# Patient Record
Sex: Male | Born: 1975
Health system: Southern US, Community
[De-identification: ages and names within clinical notes are randomized; demographics above are authoritative.]

## PROBLEM LIST (undated history)

## (undated) DIAGNOSIS — L0291 Cutaneous abscess, unspecified: Secondary | ICD-10-CM

## (undated) DIAGNOSIS — I10 Essential (primary) hypertension: Secondary | ICD-10-CM

## (undated) HISTORY — PX: HERNIA REPAIR: SHX51

## (undated) HISTORY — PX: OTHER SURGICAL HISTORY: SHX169

## (undated) HISTORY — PX: FOOT SURGERY: SHX648

---

## 1998-08-28 ENCOUNTER — Encounter: Payer: Self-pay | Admitting: *Deleted

## 1998-08-28 ENCOUNTER — Emergency Department (HOSPITAL_COMMUNITY): Admission: EM | Admit: 1998-08-28 | Discharge: 1998-08-28 | Payer: Self-pay | Admitting: *Deleted

## 2001-07-30 ENCOUNTER — Emergency Department (HOSPITAL_COMMUNITY): Admission: EM | Admit: 2001-07-30 | Discharge: 2001-07-30 | Payer: Self-pay | Admitting: Emergency Medicine

## 2001-09-15 ENCOUNTER — Emergency Department (HOSPITAL_COMMUNITY): Admission: EM | Admit: 2001-09-15 | Discharge: 2001-09-15 | Payer: Self-pay | Admitting: Emergency Medicine

## 2001-09-21 ENCOUNTER — Emergency Department (HOSPITAL_COMMUNITY): Admission: EM | Admit: 2001-09-21 | Discharge: 2001-09-21 | Payer: Self-pay | Admitting: Emergency Medicine

## 2002-01-26 ENCOUNTER — Emergency Department (HOSPITAL_COMMUNITY): Admission: EM | Admit: 2002-01-26 | Discharge: 2002-01-26 | Payer: Self-pay | Admitting: Emergency Medicine

## 2006-02-28 ENCOUNTER — Emergency Department (HOSPITAL_COMMUNITY): Admission: EM | Admit: 2006-02-28 | Discharge: 2006-02-28 | Payer: Self-pay | Admitting: Emergency Medicine

## 2006-03-22 ENCOUNTER — Emergency Department (HOSPITAL_COMMUNITY): Admission: EM | Admit: 2006-03-22 | Discharge: 2006-03-22 | Payer: Self-pay | Admitting: Emergency Medicine

## 2006-05-08 ENCOUNTER — Emergency Department (HOSPITAL_COMMUNITY): Admission: EM | Admit: 2006-05-08 | Discharge: 2006-05-08 | Payer: Self-pay | Admitting: Emergency Medicine

## 2006-05-23 ENCOUNTER — Emergency Department (HOSPITAL_COMMUNITY): Admission: EM | Admit: 2006-05-23 | Discharge: 2006-05-23 | Payer: Self-pay | Admitting: Emergency Medicine

## 2007-09-20 ENCOUNTER — Emergency Department (HOSPITAL_COMMUNITY): Admission: EM | Admit: 2007-09-20 | Discharge: 2007-09-20 | Payer: Self-pay | Admitting: Emergency Medicine

## 2009-02-14 ENCOUNTER — Emergency Department (HOSPITAL_COMMUNITY): Admission: EM | Admit: 2009-02-14 | Discharge: 2009-02-15 | Payer: Self-pay | Admitting: Emergency Medicine

## 2009-10-19 ENCOUNTER — Emergency Department (HOSPITAL_COMMUNITY): Admission: EM | Admit: 2009-10-19 | Discharge: 2009-10-19 | Payer: Self-pay | Admitting: Emergency Medicine

## 2009-10-20 ENCOUNTER — Emergency Department (HOSPITAL_COMMUNITY): Admission: EM | Admit: 2009-10-20 | Discharge: 2009-10-20 | Payer: Self-pay | Admitting: Emergency Medicine

## 2010-05-19 LAB — URINALYSIS, ROUTINE W REFLEX MICROSCOPIC
Bilirubin Urine: NEGATIVE
Glucose, UA: NEGATIVE mg/dL
Hgb urine dipstick: NEGATIVE
Ketones, ur: NEGATIVE mg/dL
Nitrite: NEGATIVE
Protein, ur: NEGATIVE mg/dL
Specific Gravity, Urine: 1.021 (ref 1.005–1.030)
Urobilinogen, UA: 0.2 mg/dL (ref 0.0–1.0)
pH: 5.5 (ref 5.0–8.0)

## 2010-05-19 LAB — GC/CHLAMYDIA PROBE AMP, GENITAL
Chlamydia, DNA Probe: NEGATIVE
GC Probe Amp, Genital: NEGATIVE

## 2010-11-14 LAB — DIFFERENTIAL
Basophils Relative: 3 — ABNORMAL HIGH
Eosinophils Absolute: 0.3
Lymphs Abs: 1.4
Monocytes Absolute: 0.8
Monocytes Relative: 11
Neutrophils Relative %: 62

## 2010-11-14 LAB — COMPREHENSIVE METABOLIC PANEL
ALT: 34
Albumin: 4.6
Alkaline Phosphatase: 60
Calcium: 9.9
GFR calc Af Amer: >60
Glucose, Bld: 94
Potassium: 4.9
Sodium: 137
Total Protein: 7.7

## 2010-11-14 LAB — HEMOGLOBINOPATHY EVALUATION
Hgb A2 Quant: 2.8
Hgb A: 97.2 %
Hgb F Quant: 0 (ref 0.0–2.0)

## 2010-11-14 LAB — RETICULOCYTES
RBC.: 4.76
Retic Count, Absolute: 109.5

## 2010-11-14 LAB — CBC
MCHC: 34.9
Platelets: 294
RDW: 12.4

## 2010-11-14 LAB — RHEUMATOID FACTOR: Rhuematoid fact SerPl-aCnc: <20

## 2010-12-01 ENCOUNTER — Emergency Department (HOSPITAL_COMMUNITY)
Admission: EM | Admit: 2010-12-01 | Discharge: 2010-12-01 | Disposition: A | Discharge status: Home / Self Care | Payer: Self-pay | Attending: Emergency Medicine | Admitting: Emergency Medicine

## 2010-12-01 DIAGNOSIS — K029 Dental caries, unspecified: Secondary | ICD-10-CM | POA: Insufficient documentation

## 2010-12-01 DIAGNOSIS — A64 Unspecified sexually transmitted disease: Secondary | ICD-10-CM | POA: Insufficient documentation

## 2010-12-01 DIAGNOSIS — K1370 Unspecified lesions of oral mucosa: Secondary | ICD-10-CM | POA: Insufficient documentation

## 2010-12-29 ENCOUNTER — Emergency Department (HOSPITAL_COMMUNITY)
Admission: EM | Admit: 2010-12-29 | Discharge: 2010-12-29 | Disposition: A | Discharge status: Home / Self Care | Payer: Self-pay | Attending: Emergency Medicine | Admitting: Emergency Medicine

## 2010-12-29 ENCOUNTER — Encounter: Payer: Self-pay | Admitting: *Deleted

## 2010-12-29 DIAGNOSIS — K0889 Other specified disorders of teeth and supporting structures: Secondary | ICD-10-CM

## 2010-12-29 DIAGNOSIS — K089 Disorder of teeth and supporting structures, unspecified: Secondary | ICD-10-CM | POA: Insufficient documentation

## 2010-12-29 DIAGNOSIS — K029 Dental caries, unspecified: Secondary | ICD-10-CM | POA: Insufficient documentation

## 2010-12-29 MED ORDER — PENICILLIN V POTASSIUM 500 MG PO TABS: 500.0000 mg | ORAL_TABLET | Freq: Three times a day (TID) | ORAL | Status: AC
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
## 2011-04-05 ENCOUNTER — Emergency Department (HOSPITAL_COMMUNITY): Admission: EM | Admit: 2011-04-05 | Discharge: 2011-04-05 | Discharge status: Against Medical Advice | Payer: Self-pay

## 2012-11-23 ENCOUNTER — Other Ambulatory Visit (HOSPITAL_COMMUNITY): Payer: Self-pay

## 2012-12-08 ENCOUNTER — Other Ambulatory Visit (HOSPITAL_COMMUNITY): Payer: Self-pay

## 2012-12-20 ENCOUNTER — Other Ambulatory Visit (HOSPITAL_COMMUNITY): Payer: Self-pay | Admitting: Family Medicine

## 2012-12-20 DIAGNOSIS — R011 Cardiac murmur, unspecified: Secondary | ICD-10-CM

## 2012-12-22 ENCOUNTER — Other Ambulatory Visit (HOSPITAL_COMMUNITY): Payer: Self-pay

## 2012-12-23 ENCOUNTER — Encounter (HOSPITAL_COMMUNITY): Payer: Self-pay | Admitting: Family Medicine

## 2012-12-28 ENCOUNTER — Encounter (HOSPITAL_COMMUNITY): Payer: Self-pay | Admitting: Family Medicine

## 2013-05-06 ENCOUNTER — Encounter (HOSPITAL_COMMUNITY): Payer: Self-pay | Admitting: Emergency Medicine

## 2013-05-06 ENCOUNTER — Emergency Department (INDEPENDENT_AMBULATORY_CARE_PROVIDER_SITE_OTHER)
Admission: EM | Admit: 2013-05-06 | Discharge: 2013-05-06 | Disposition: A | Discharge status: Home / Self Care | Payer: 59 | Source: Home / Self Care | Attending: Family Medicine | Admitting: Family Medicine

## 2013-05-06 DIAGNOSIS — A088 Other specified intestinal infections: Secondary | ICD-10-CM

## 2013-05-06 DIAGNOSIS — A084 Viral intestinal infection, unspecified: Secondary | ICD-10-CM

## 2013-05-06 MED ORDER — ONDANSETRON HCL 8 MG PO TABS: 8.0000 mg | ORAL_TABLET | Freq: Three times a day (TID) | ORAL | Status: DC | PRN

## 2013-05-06 NOTE — ED Provider Notes (Signed)
Wesley Best is a 38 y.o. male who presents to Urgent Care today for abdominal pain and diarrhea present for one week. Patient had worsening abdominal pain yesterday. His abdominal pain is currently resolving. He had one episode of vomiting yesterday as well. He had diarrhea all. The previous week however this is also resolving. He denies any blood in the stool vomiting blood per stool or coffee-ground emesis. He denies any fevers or chills. He's tried some Pepto-Bismol which did not help much. He currently feels well.   History reviewed. No pertinent past medical history. History  Substance Use Topics  . Smoking status: Current Every Day Smoker    Types: Cigarettes  . Smokeless tobacco: Not on file  . Alcohol Use: Yes   ROS as above Medications: No current facility-administered medications for this encounter.   Current Outpatient Prescriptions  Medication Sig Dispense Refill  . acetaminophen-codeine (TYLENOL #3) 300-30 MG per tablet Take 1 tablet by mouth every 6 (six) hours as needed. pain       . Ibuprofen-Diphenhydramine Cit (IBUPROFEN PM) 200-38 MG TABS Take 2 tablets by mouth at bedtime.        . ondansetron (ZOFRAN) 8 MG tablet Take 1 tablet (8 mg total) by mouth every 8 (eight) hours as needed for nausea or vomiting.  20 tablet  0    Exam:  BP 147/108  Pulse 83  Temp(Src) 98.4 F (36.9 C) (Oral)  Resp 19  SpO2 100% Gen: Well NAD nontoxic appearing HEENT: EOMI,  MMM Lungs: Normal work of breathing. CTABL Heart: RRR no MRG Abd: NABS, Soft. NT, ND no rebound or guarding Exts: Brisk capillary refill, warm and well perfused.    Assessment and Plan: 38 y.o. male with viral gastroenteritis. Plan for symptomatic management with Zofran and Imodium. Followup as needed.  Discussed warning signs or symptoms. Please see discharge instructions. Patient expresses understanding.    Rodolph BongEvan S Aloysious Vangieson, MD 05/06/13 [phone] [id number]

## 2013-05-06 NOTE — ED Notes (Signed)
Pt c/o abd pain onset 1 week Sxs include: f/v/d States his wife and children of the stomach virus Not taking any meds for d/c He is alert w/no signs of acute distress.

## 2013-05-06 NOTE — Discharge Instructions (Signed)
Thank you for coming in today. Zofran as needed for vomiting. Use over-the-counter Imodium as directed as needed for diarrhea. Do not take Imodium if you have severe abdominal pain or blood in the stool. If your belly pain worsens, or you have high fever, bad vomiting, blood in your stool or black tarry stool go to the Emergency Room.   Viral Gastroenteritis Viral gastroenteritis is also known as stomach flu. This condition affects the stomach and intestinal tract. It can cause sudden diarrhea and vomiting. The illness typically lasts 3 to 8 days. Most people develop an immune response that eventually gets rid of the virus. While this natural response develops, the virus can make you quite ill. CAUSES  Many different viruses can cause gastroenteritis, such as rotavirus or noroviruses. You can catch one of these viruses by consuming contaminated food or water. You may also catch a virus by sharing utensils or other personal items with an infected person or by touching a contaminated surface. SYMPTOMS  The most common symptoms are diarrhea and vomiting. These problems can cause a severe loss of body fluids (dehydration) and a body salt (electrolyte) imbalance. Other symptoms may include:  Fever.  Headache.  Fatigue.  Abdominal pain. DIAGNOSIS  Your caregiver can usually diagnose viral gastroenteritis based on your symptoms and a physical exam. A stool sample may also be taken to test for the presence of viruses or other infections. TREATMENT  This illness typically goes away on its own. Treatments are aimed at rehydration. The most serious cases of viral gastroenteritis involve vomiting so severely that you are not able to keep fluids down. In these cases, fluids must be given through an intravenous line (IV). HOME CARE INSTRUCTIONS   Drink enough fluids to keep your urine clear or pale yellow. Drink small amounts of fluids frequently and increase the amounts as tolerated.  Ask your  caregiver for specific rehydration instructions.  Avoid:  Foods high in sugar.  Alcohol.  Carbonated drinks.  Tobacco.  Juice.  Caffeine drinks.  Extremely hot or cold fluids.  Fatty, greasy foods.  Too much intake of anything at one time.  Dairy products until 24 to 48 hours after diarrhea stops.  You may consume probiotics. Probiotics are active cultures of beneficial bacteria. They may lessen the amount and number of diarrheal stools in adults. Probiotics can be found in yogurt with active cultures and in supplements.  Wash your hands well to avoid spreading the virus.  Only take over-the-counter or prescription medicines for pain, discomfort, or fever as directed by your caregiver. Do not give aspirin to children. Antidiarrheal medicines are not recommended.  Ask your caregiver if you should continue to take your regular prescribed and over-the-counter medicines.  Keep all follow-up appointments as directed by your caregiver. SEEK IMMEDIATE MEDICAL CARE IF:   You are unable to keep fluids down.  You do not urinate at least once every 6 to 8 hours.  You develop shortness of breath.  You notice blood in your stool or vomit. This may look like coffee grounds.  You have abdominal pain that increases or is concentrated in one small area (localized).  You have persistent vomiting or diarrhea.  You have a fever.  The patient is a child younger than 3 months, and he or she has a fever.  The patient is a child older than 3 months, and he or she has a fever and persistent symptoms.  The patient is a child older than 3 months, and he  or she has a fever and symptoms suddenly get worse.  The patient is a baby, and he or she has no tears when crying. MAKE SURE YOU:   Understand these instructions.  Will watch your condition.  Will get help right away if you are not doing well or get worse. Document Released: 02/02/2005 Document Revised: 04/27/2011 Document  Reviewed: 11/19/2010 [facility] Patient Information 2014 [place], [place].

## 2013-09-08 ENCOUNTER — Encounter (HOSPITAL_COMMUNITY): Payer: Self-pay | Admitting: Emergency Medicine

## 2013-09-08 ENCOUNTER — Emergency Department (HOSPITAL_COMMUNITY)
Admission: EM | Admit: 2013-09-08 | Discharge: 2013-09-08 | Disposition: A | Discharge status: Home / Self Care | Payer: 59 | Attending: Emergency Medicine | Admitting: Emergency Medicine

## 2013-09-08 ENCOUNTER — Emergency Department (HOSPITAL_COMMUNITY): Payer: 59

## 2013-09-08 DIAGNOSIS — Z880 Allergy status to penicillin: Secondary | ICD-10-CM | POA: Insufficient documentation

## 2013-09-08 DIAGNOSIS — K2970 Gastritis, unspecified, without bleeding: Secondary | ICD-10-CM | POA: Diagnosis not present

## 2013-09-08 DIAGNOSIS — Z9889 Other specified postprocedural states: Secondary | ICD-10-CM | POA: Diagnosis not present

## 2013-09-08 DIAGNOSIS — R072 Precordial pain: Secondary | ICD-10-CM | POA: Diagnosis present

## 2013-09-08 DIAGNOSIS — K2990 Gastroduodenitis, unspecified, without bleeding: Secondary | ICD-10-CM | POA: Diagnosis not present

## 2013-09-08 DIAGNOSIS — F17200 Nicotine dependence, unspecified, uncomplicated: Secondary | ICD-10-CM | POA: Diagnosis not present

## 2013-09-08 LAB — CBC
HCT: 44.1 % (ref 39.0–52.0)
Hemoglobin: 15.5 g/dL (ref 13.0–17.0)
MCH: 33 pg (ref 26.0–34.0)
MCHC: 35.1 g/dL (ref 30.0–36.0)
MCV: 94 fL (ref 78.0–100.0)
PLATELETS: 275 10*3/uL (ref 150–400)
RBC: 4.69 MIL/uL (ref 4.22–5.81)
RDW: 12.7 % (ref 11.5–15.5)
WBC: 4.7 10*3/uL (ref 4.0–10.5)

## 2013-09-08 LAB — BASIC METABOLIC PANEL
ANION GAP: 16 — AB (ref 5–15)
BUN: 14 mg/dL (ref 6–23)
CALCIUM: 9.2 mg/dL (ref 8.4–10.5)
CO2: 21 mEq/L (ref 19–32)
CREATININE: 0.92 mg/dL (ref 0.50–1.35)
Chloride: 103 mEq/L (ref 96–112)
Glucose, Bld: 148 mg/dL — ABNORMAL HIGH (ref 70–99)
Potassium: 3.9 mEq/L (ref 3.7–5.3)
Sodium: 140 mEq/L (ref 137–147)

## 2013-09-08 LAB — URINALYSIS, ROUTINE W REFLEX MICROSCOPIC
BILIRUBIN URINE: NEGATIVE
Glucose, UA: NEGATIVE mg/dL
HGB URINE DIPSTICK: NEGATIVE
KETONES UR: NEGATIVE mg/dL
Leukocytes, UA: NEGATIVE
NITRITE: NEGATIVE
Protein, ur: NEGATIVE mg/dL
Specific Gravity, Urine: 1.021 (ref 1.005–1.030)
UROBILINOGEN UA: 0.2 mg/dL (ref 0.0–1.0)
pH: 6 (ref 5.0–8.0)

## 2013-09-08 LAB — I-STAT TROPONIN, ED: Troponin i, poc: 0 ng/mL (ref 0.00–0.08)

## 2013-09-08 MED ORDER — PANTOPRAZOLE SODIUM 40 MG PO TBEC: 40.0000 mg | DELAYED_RELEASE_TABLET | Freq: Every day | ORAL | Status: DC

## 2013-09-08 MED ORDER — PANTOPRAZOLE SODIUM 40 MG PO TBEC
40.0000 mg | DELAYED_RELEASE_TABLET | Freq: Once | ORAL | Status: AC
  Administered 2013-09-08: 40 mg via ORAL
  Filled 2013-09-08: qty 1

## 2013-09-08 MED ORDER — SUCRALFATE 1 G PO TABS: 1.0000 g | ORAL_TABLET | Freq: Three times a day (TID) | ORAL | Status: DC

## 2013-09-08 MED ORDER — GI COCKTAIL ~~LOC~~
30.0000 mL | Freq: Once | ORAL | Status: AC
  Administered 2013-09-08: 30 mL via ORAL
  Filled 2013-09-08: qty 30

## 2013-09-08 NOTE — Discharge Instructions (Signed)

## 2013-09-08 NOTE — ED Notes (Signed)
Pt is aware we need urine sample. Pt is drinking H2O and said he will attempt to void when finished.

## 2013-09-08 NOTE — ED Provider Notes (Signed)
CSN: [id number]     Arrival date & time 09/08/13  1626 History   First MD Initiated Contact with Patient 09/08/13 1637     Chief Complaint  Patient presents with  . Chest Pain     (Consider location/radiation/quality/duration/timing/severity/associated sxs/prior Treatment) Patient is a 38 y.o. male presenting with chest pain. The history is provided by the patient.  Chest Pain Pain location:  Substernal area Pain quality: burning   Radiates to: throat. Pain radiates to the back: no   Pain severity:  Mild Onset quality:  Sudden Timing:  Intermittent Progression:  Worsening Chronicity:  Recurrent Context: at rest   Relieved by: drinking water. Worsened by:  Nothing tried Associated symptoms: nausea   Associated symptoms: no abdominal pain, no cough, no fever, no shortness of breath and not vomiting     History reviewed. No pertinent past medical history. Past Surgical History  Procedure Laterality Date  . Hernia repair     No family history on file. History  Substance Use Topics  . Smoking status: Current Every Day Smoker -- 0.50 packs/day    Types: Cigarettes  . Smokeless tobacco: Never Used  . Alcohol Use: Yes     Comment: Pt reports drinking 2 - 40 oz per day    Review of Systems  Constitutional: Negative for fever.  Respiratory: Negative for cough and shortness of breath.   Cardiovascular: Positive for chest pain.  Gastrointestinal: Positive for nausea. Negative for vomiting and abdominal pain.  All other systems reviewed and are negative.     Allergies  Penicillins  Home Medications   Prior to Admission medications   Medication Sig Start Date End Date Taking? Authorizing Provider  DiphenhydrAMINE HCl, Sleep, (ZZZQUIL PO) Take 1 capsule by mouth at bedtime as needed (sleep.).   Yes Historical Provider, MD   BP 127/84  Pulse 70  Temp(Src) 98 F (36.7 C) (Oral)  Resp 16  SpO2 98% Physical Exam  Nursing note and vitals reviewed. Constitutional: He  is oriented to person, place, and time. He appears well-developed and well-nourished. No distress.  HENT:  Head: Normocephalic and atraumatic.  Mouth/Throat: Oropharynx is clear and moist. No oropharyngeal exudate.  Eyes: EOM are normal. Pupils are equal, round, and reactive to light.  Neck: Normal range of motion. Neck supple.  Cardiovascular: Normal rate and regular rhythm.  Exam reveals no friction rub.   No murmur heard. Pulmonary/Chest: Effort normal and breath sounds normal. No respiratory distress. He has no wheezes. He has no rales.  Abdominal: Soft. He exhibits no distension. There is no tenderness. There is no rebound.  Musculoskeletal: Normal range of motion. He exhibits no edema.  Neurological: He is alert and oriented to person, place, and time. He exhibits normal muscle tone.  Skin: No rash noted. He is not diaphoretic.    ED Course  Procedures (including critical care time) Labs Review Labs Reviewed  CBC  BASIC METABOLIC PANEL  Rosezena Sensor, ED    Imaging Review Dg Chest 2 View  09/08/2013   CLINICAL DATA:  Chest pain.  EXAM: CHEST  2 VIEW  COMPARISON:  None.  FINDINGS: Mediastinum and hilar structures normal. The lungs are clear. Heart size normal. No pleural effusion or pneumothorax.  IMPRESSION: No acute cardiopulmonary disease.   Electronically Signed   By: Maisie Fus  Register   On: 09/08/2013 17:36     EKG Interpretation   Date/Time:  Friday September 08 2013 16:32:49 EDT Ventricular Rate:  71 PR Interval:  181 QRS Duration:  85 QT Interval:  389 QTC Calculation: 423 R Axis:   101 Text Interpretation:  Sinus rhythm Biatrial enlargement Right axis  deviation Consider left ventricular hypertrophy No prior Confirmed by  Gwendolyn GrantWALDEN  MD, Aamir Mclinden ([id number]) on 09/08/2013 4:36:02 PM      MDM   Final diagnoses:  Gastritis    89M here with burning chest pain. Began last night while lying down. States he drank more than normal last night. Described as burning, radiates up  to his throat. Better with water, but it will recur. Has had this previously, was not given any prescriptions and was told he might have bronchitis. Denies any cough, fever, SOB, vomiting. Mild nausea. Symptoms c/w GI related CP. Will give PPI and GI cocktail. Multiple risk factors for gastritis like smoking, alcohol use. Feeling better after GI cocktail. UA checked because of some stinging sensation with urination - negative. Stable for discharge.     Dagmar HaitWilliam Ona Roehrs, MD 09/08/13 2219

## 2013-09-08 NOTE — ED Notes (Addendum)
Pt reports centralized chest pain that began last night. Pt reports having similar pain before, which he describes as a burning sensation. Pt reports nausea, emesis, and lightheadedness; however denies shortness of breath. Pt reports smoking 0.5 packs of cigarette per day and having a history of hypertension. Pt is A/O x4 and in NAD.

## 2013-09-08 NOTE — ED Notes (Signed)
Patient transported to X-ray 

## 2014-07-10 ENCOUNTER — Emergency Department (HOSPITAL_COMMUNITY)
Admission: EM | Admit: 2014-07-10 | Discharge: 2014-07-11 | Disposition: A | Discharge status: Home / Self Care | Payer: 59 | Attending: Emergency Medicine | Admitting: Emergency Medicine

## 2014-07-10 ENCOUNTER — Encounter (HOSPITAL_COMMUNITY): Payer: Self-pay

## 2014-07-10 DIAGNOSIS — Z79899 Other long term (current) drug therapy: Secondary | ICD-10-CM | POA: Insufficient documentation

## 2014-07-10 DIAGNOSIS — Z720 Tobacco use: Secondary | ICD-10-CM | POA: Diagnosis not present

## 2014-07-10 DIAGNOSIS — Z880 Allergy status to penicillin: Secondary | ICD-10-CM | POA: Diagnosis not present

## 2014-07-10 DIAGNOSIS — R21 Rash and other nonspecific skin eruption: Secondary | ICD-10-CM | POA: Diagnosis present

## 2014-07-10 DIAGNOSIS — B86 Scabies: Secondary | ICD-10-CM | POA: Diagnosis not present

## 2014-07-10 NOTE — ED Notes (Signed)
Pt complains of a rash in his groin area for several years but lately its worse and he's digging in his skin scratching.

## 2014-07-11 MED ORDER — PERMETHRIN 5 % EX CREA: TOPICAL_CREAM | CUTANEOUS | Status: DC

## 2014-07-11 NOTE — Discharge Instructions (Signed)
These follow the directions provided. Be sure to follow-up with your primary care doctor to make sure you getting better. Use the permethrin cream as directed. He start by taking a warm soapy water bath applying the cream from your neck down to the bottom of your feet and leave it on for 8-12 hours. After the 8-12 hour period another warm soapy water bath to wash it off. 1 application should be sufficient but will take several weeks before the itching will stop. Be sure to put all your closed into a dryer 40 wear it again to make sure you do not spread the infestation. Don't hesitate to return for any new, worsening, or concerning symptoms.   SEEK MEDICAL CARE IF:  The itching persists longer than 4 weeks after treatment.  The rash spreads or becomes infected. Signs of infection include red blisters or yellow-tan crust.

## 2014-07-11 NOTE — ED Provider Notes (Signed)
CSN: [id number]     Arrival date & time 07/10/14  2317 History   First MD Initiated Contact with Patient 07/11/14 0122     Chief Complaint  Patient presents with  . Rash   (Consider location/radiation/quality/duration/timing/severity/associated sxs/prior Treatment) HPI Wesley Best is a 39 year old male presenting with rash. He states this episode of the rash began 3 weeks ago. He reports it as an itchy rash in his groin and along the inside and the back of both of his thighs. Her been no new changes tonight to make him seek care in emergency room he is just tired of itching. He denies any fevers, chills, wounds or recent illnesses or injuries.  History reviewed. No pertinent past medical history. Past Surgical History  Procedure Laterality Date  . Hernia repair     History reviewed. No pertinent family history. History  Substance Use Topics  . Smoking status: Current Every Day Smoker -- 0.50 packs/day    Types: Cigarettes  . Smokeless tobacco: Never Used  . Alcohol Use: Yes     Comment: Pt reports drinking 2 - 40 oz per day    Review of Systems  Constitutional: Negative for fever and chills.  Skin: Positive for rash.      Allergies  Penicillins  Home Medications   Prior to Admission medications   Medication Sig Start Date End Date Taking? Authorizing Provider  DiphenhydrAMINE HCl, Sleep, (ZZZQUIL PO) Take 1 capsule by mouth at bedtime as needed (sleep.).    Historical Provider, MD  pantoprazole (PROTONIX) 40 MG tablet Take 1 tablet (40 mg total) by mouth daily. 09/08/13   Elwin Mocha, MD  sucralfate (CARAFATE) 1 G tablet Take 1 tablet (1 g total) by mouth 4 (four) times daily -  with meals and at bedtime. 09/08/13   Elwin Mocha, MD   BP 142/95 mmHg  Pulse 103  Temp(Src) 98.5 F (36.9 C) (Oral)  Resp 18  Ht  (1.88 m)  Wt 170 lb (77.111 kg)  BMI 21.82 kg/m2  SpO2 99% Physical Exam  Constitutional: He appears well-developed and well-nourished. No distress.   HENT:  Head: Normocephalic and atraumatic.  Eyes: Conjunctivae are normal. Right eye exhibits no discharge. Left eye exhibits no discharge. No scleral icterus.  Cardiovascular: Intact distal pulses.   Pulmonary/Chest: Effort normal.  Neurological: He is alert. Coordination normal.  Skin: Skin is warm. Rash noted. He is not diaphoretic.  Linear, erythematous, pruritic rash on medial bilat thighs, and pubic area  Nursing note and vitals reviewed.   ED Course  Procedures (including critical care time) Labs Review Labs Reviewed - No data to display  Imaging Review No results found.   EKG Interpretation None      MDM   Final diagnoses:  Scabies   39 yo with rash consistent with scabies. Discussed dagnosis & treatment of scabies with pt.  They have been advised symptoms may take up to 2 weeks resolve. They have also been advised to clean entire household including washing sheets and using R.I.D. spray in the car and on sofa.   The use of permethrin cream was discussed as well, they were told to use cream from head to toe & leave on for 8-12 hours.  They've been advised to repeat treatment if new eruptions occur. Pt is well-appearing, in no acute distress and vital signs are stable.  They appear safe to be discharged.  Discharge include follow-up with their PCP.  Return precautions provided.    Filed Vitals:  07/10/14 2330 07/11/14 0242 07/11/14 0244  BP: 142/95 120/77 120/77  Pulse: 103 98 98  Temp: 98.5 F (36.9 C)    TempSrc: Oral    Resp: 18  14  Height: 6\' 2"  (1.88 m)    Weight: 170 lb (77.111 kg)    SpO2: 99% 97% 98%   Meds given in ED:  Medications - No data to display  Discharge Medication List as of 07/11/2014  2:32 AM    START taking these medications   Details  permethrin (ELIMITE) 5 % cream Apply to affected area once, Print           Harle BattiestElizabeth Hitoshi Werts, NP 07/13/14 [id number]  Tomasita CrumbleAdeleke Oni, MD 07/13/14 1749

## 2014-08-12 ENCOUNTER — Emergency Department (INDEPENDENT_AMBULATORY_CARE_PROVIDER_SITE_OTHER)
Admission: EM | Admit: 2014-08-12 | Discharge: 2014-08-12 | Disposition: A | Discharge status: Home / Self Care | Payer: Commercial Managed Care - HMO | Source: Home / Self Care | Attending: Family Medicine | Admitting: Family Medicine

## 2014-08-12 ENCOUNTER — Encounter (HOSPITAL_COMMUNITY): Payer: Self-pay | Admitting: Emergency Medicine

## 2014-08-12 DIAGNOSIS — I10 Essential (primary) hypertension: Secondary | ICD-10-CM | POA: Diagnosis not present

## 2014-08-12 DIAGNOSIS — R21 Rash and other nonspecific skin eruption: Secondary | ICD-10-CM

## 2014-08-12 MED ORDER — PERMETHRIN 5 % EX CREA: TOPICAL_CREAM | CUTANEOUS | Status: DC

## 2014-08-12 MED ORDER — NYSTATIN 100000 UNIT/GM EX CREA: 1.0000 "application " | TOPICAL_CREAM | Freq: Two times a day (BID) | CUTANEOUS | Status: DC

## 2014-08-12 NOTE — ED Provider Notes (Signed)
CSN: [id number]     Arrival date & time 08/12/14  1744 History   First MD Initiated Contact with Patient 08/12/14 1829     Chief Complaint  Patient presents with  . Rash   (Consider location/radiation/quality/duration/timing/severity/associated sxs/prior Treatment) HPI  Groin rash. Ongoing 8+ wks. Seeni n ED on 5/24 and given permethrin cream for scabies. Used cream as prescribed x1 but did not use a second time.  cortizone 10 w/ some improvement.    History reviewed. No pertinent past medical history. Past Surgical History  Procedure Laterality Date  . Hernia repair     Family History  Problem Relation Age of Onset  . Diabetes Neg Hx   . Heart failure Neg Hx    History  Substance Use Topics  . Smoking status: Current Every Day Smoker -- 0.50 packs/day    Types: Cigarettes  . Smokeless tobacco: Never Used  . Alcohol Use: Yes     Comment: Pt reports drinking 2 - 40 oz per day    Review of Systems Per HPI with all other pertinent systems negative.   Allergies  Penicillins  Home Medications   Prior to Admission medications   Medication Sig Start Date End Date Taking? Authorizing Provider  nystatin cream (MYCOSTATIN) Apply 1 application topically 2 (two) times daily. Treat for 7 days 08/12/14   Ozella Rocks, MD  permethrin (ELIMITE) 5 % cream Apply topically to entire body and leave on overnight. Wash off in the morning and reapply in 14 days 08/12/14   Ozella Rocks, MD   BP 161/98 mmHg  Pulse 69  Temp(Src) 97.6 F (36.4 C) (Oral)  Resp 16  SpO2 95% Physical Exam Physical Exam  Constitutional: oriented to person, place, and time. appears well-developed and well-nourished. No distress.  HENT:  Head: Normocephalic and atraumatic.  Eyes: EOMI. PERRL.  Neck: Normal range of motion.  Cardiovascular: RRR, no m/r/g, 2+ distal pulses,  Pulmonary/Chest: Effort normal and breath sounds normal. No respiratory distress.  Abdominal: Soft. Bowel sounds are normal.  NonTTP, no distension.  Musculoskeletal: Normal range of motion. Non ttp, no effusion.  Neurological: alert and oriented to person, place, and time.  Skin: Mild papular rash in groin region, most predominant on the right, no inguinal adenopathy, penile rash or lesions, or penile discharge.  Psychiatric: normal mood and affect. behavior is normal. Judgment and thought content normal. '   ED Course  Procedures (including critical care time) Labs Review Labs Reviewed  HIV ANTIBODY (ROUTINE TESTING)  RPR    Imaging Review No results found.   MDM   1. Groin rash   2. Essential hypertension    Corresponding to the clear. Patient may have scabies which was inadequately treated with the previous prescription for permethrin. Patient to reduce permethrin and then using again in 14 days. Patient also given her prescription for nystatin case there are some underlying fungal etiology. Will obtain RPR and HIV. Patient's blood pressure elevated. Patient with PCP if remains elevated in the future.    Ozella Rocks, MD 08/12/14 [phone]

## 2014-08-12 NOTE — ED Notes (Signed)
Pt states that he has a rash on bilateral groin he states that it has been there for years.

## 2014-08-12 NOTE — Discharge Instructions (Signed)
The cause of your rashes not immediately clear. This may be due to scabies that will require 2 treatments of the permethrin. Please use the permethrin as prescribed. May also be related to a fungal infection. Please use the nystatin cream as prescribed as well. If your problems persist please follow up with routine doctor or the emergency room.

## 2014-08-12 NOTE — ED Notes (Deleted)
Mom brings pt in for ST associated w/fevers and chills onset 1 week Also reports decreased eating Alert, no signs of acute distress.

## 2014-08-13 LAB — RPR: RPR Ser Ql: NONREACTIVE

## 2014-08-13 LAB — HIV ANTIBODY (ROUTINE TESTING W REFLEX): HIV SCREEN 4TH GENERATION: NONREACTIVE

## 2014-08-13 NOTE — ED Notes (Signed)
Final report RPR negative

## 2014-08-13 NOTE — ED Notes (Signed)
HIV report negative

## 2015-12-24 ENCOUNTER — Emergency Department (HOSPITAL_COMMUNITY)
Admission: EM | Admit: 2015-12-24 | Discharge: 2015-12-24 | Disposition: A | Discharge status: Home / Self Care | Payer: Commercial Managed Care - HMO | Attending: Emergency Medicine | Admitting: Emergency Medicine

## 2015-12-24 ENCOUNTER — Encounter (HOSPITAL_COMMUNITY): Payer: Self-pay | Admitting: Emergency Medicine

## 2015-12-24 DIAGNOSIS — L02411 Cutaneous abscess of right axilla: Secondary | ICD-10-CM | POA: Diagnosis not present

## 2015-12-24 DIAGNOSIS — F17210 Nicotine dependence, cigarettes, uncomplicated: Secondary | ICD-10-CM | POA: Diagnosis not present

## 2015-12-24 MED ORDER — LIDOCAINE-EPINEPHRINE (PF) 2 %-1:200000 IJ SOLN
10.0000 mL | Freq: Once | INTRAMUSCULAR | Status: AC
  Administered 2015-12-24: 5 mL
  Filled 2015-12-24: qty 20

## 2015-12-24 NOTE — ED Notes (Signed)
Dressing of 4x4s and hyperfix applied to patient's right axilla.

## 2015-12-24 NOTE — ED Provider Notes (Signed)
[facility]-EMERGENCY DEPT Provider Note   CSN: [id number] Arrival date & time: 12/24/15  1723  By signing my name below, I, Soijett Blue, attest that this documentation has been prepared under the direction and in the presence of Bethel BornKelly Marie Terrin Imparato, PA-C Electronically Signed: Soijett Blue, ED Scribe. 12/24/15. 6:55 PM.   History   Chief Complaint Chief Complaint  Patient presents with  . Abscess    right axillary    HPI Wesley Best is a 40 y.o. male who presents to the Emergency Department complaining of right axilla abscess onset 2 days. Pt notes that he has had a hx of abscesses to this same area that resolved on their own without being I&D. He states that he is having associated symptoms of mild drainage and mild redness to the area. He states that he has not tried warm compresses/soaks or medications for the relief of his symptoms. He denies fever, chills, and any other symptoms. Denies allergies to medications.   The history is provided by the patient. No language interpreter was used.    History reviewed. No pertinent past medical history.  There are no active problems to display for this patient.   Past Surgical History:  Procedure Laterality Date  . HERNIA REPAIR         Home Medications    Prior to Admission medications   Medication Sig Start Date End Date Taking? Authorizing Provider  nystatin cream (MYCOSTATIN) Apply 1 application topically 2 (two) times daily. Treat for 7 days 08/12/14   Ozella Rocksavid J Merrell, MD  permethrin (ELIMITE) 5 % cream Apply topically to entire body and leave on overnight. Wash off in the morning and reapply in 14 days 08/12/14   Ozella Rocksavid J Merrell, MD    Family History Family History  Problem Relation Age of Onset  . Diabetes Neg Hx   . Heart failure Neg Hx     Social History Social History  Substance Use Topics  . Smoking status: Current Every Day Smoker    Packs/day: 0.50    Types: Cigarettes  . Smokeless tobacco: Never Used  .  Alcohol use Yes     Comment: Pt reports drinking 2 - 40 oz per day     Allergies   Penicillins   Review of Systems Review of Systems  Constitutional: Negative for chills and fever.  Skin: Positive for color change (redness to the affected area).       Abscess to right axilla without drainage.     Physical Exam Updated Vital Signs BP 147/96   Pulse 86   Temp 98.2 F (36.8 C)   Resp 16   SpO2 96%   Physical Exam  Constitutional: He is oriented to person, place, and time. He appears well-developed and well-nourished. No distress.  HENT:  Head: Normocephalic and atraumatic.  Eyes: EOM are normal.  Neck: Neck supple.  Cardiovascular: Normal rate.   Pulmonary/Chest: Effort normal. No respiratory distress.  Abdominal: He exhibits no distension.  Musculoskeletal: Normal range of motion.  Neurological: He is alert and oriented to person, place, and time.  Skin: Skin is warm and dry. There is erythema.  3.5 x 3 cm abscess to right axilla with erythema and surrounding cellulitis. Area is TTP. No active drainage.   Psychiatric: He has a normal mood and affect. His behavior is normal.  Nursing note and vitals reviewed.    ED Treatments / Results  DIAGNOSTIC STUDIES: Oxygen Saturation is 96% on RA, nl by my interpretation.  COORDINATION OF CARE: 5:55 PM Discussed treatment plan with pt at bedside which includes I&D, abx Rx, and pt agreed to plan.   Procedures .Marland Kitchen.Incision and Drainage Date/Time: 12/24/2015 6:35 PM Performed by: Terance HartGEKAS, Tamitha Norell MARIE Authorized by: Terance HartGEKAS, Emauri Krygier MARIE   Consent:    Consent obtained:  Verbal   Consent given by:  Patient   Risks discussed:  Pain and infection   Alternatives discussed:  Alternative treatment Location:    Type:  Abscess   Size:  3.5 x 3 cm   Location:  Upper extremity   Upper extremity location: right axilla. Pre-procedure details:    Skin preparation:  Betadine Anesthesia (see MAR for exact dosages):    Anesthesia  method:  Local infiltration   Local anesthetic:  Lidocaine 2% WITH epi (4 cc used) Procedure type:    Complexity:  Simple Procedure details:    Needle aspiration: no     Incision types:  Single straight   Incision depth:  Dermal   Scalpel blade:  11   Wound management:  Probed and deloculated and irrigated with saline   Drainage:  Purulent   Drainage amount:  Moderate   Wound treatment:  Wound left open   Packing materials:  1/4 in iodoform gauze   Amount 1/4" iodoform:  10 cm used Post-procedure details:    Patient tolerance of procedure:  Tolerated well, no immediate complications       (including critical care time)  Medications Ordered in ED Medications - No data to display   Initial Impression / Assessment and Plan / ED Course  I have reviewed the triage vital signs and the nursing notes.  Clinical Course    Patient with skin abscess. Incision and drainage performed in the ED today.  Abscess was packed with iodoform gauze. Wound recheck in 2 days. Supportive care and return precautions discussed. The patient appears reasonably screened and/or stabilized for discharge and I doubt any other emergent medical condition requiring further screening, evaluation, or treatment in the ED prior to discharge.  Final Clinical Impressions(s) / ED Diagnoses   Final diagnoses:  Abscess of axilla, right    New Prescriptions Discharge Medication List as of 12/24/2015  7:02 PM     I personally performed the services described in this documentation, which was scribed in my presence. The recorded information has been reviewed and is accurate.     Bethel BornKelly Marie Elinda Bunten, PA-C 12/24/15 2038    Donnetta HutchingBrian Cook, MD 12/27/15 [phone]

## 2015-12-24 NOTE — ED Triage Notes (Signed)
Pt reports right axillary abscess x 2 days. sts Hx abscess same area. Obvious edema, redness in area.

## 2015-12-24 NOTE — Discharge Instructions (Signed)
Return for wound check in 2 days and to have packing removed Change dressing daily Return if your symptoms are worsening or develop a fever You can take Ibuprofen or Tylenol for pain

## 2015-12-27 ENCOUNTER — Emergency Department (HOSPITAL_COMMUNITY)
Admission: EM | Admit: 2015-12-27 | Discharge: 2015-12-27 | Disposition: A | Discharge status: Home / Self Care | Payer: Commercial Managed Care - HMO | Attending: Emergency Medicine | Admitting: Emergency Medicine

## 2015-12-27 ENCOUNTER — Encounter (HOSPITAL_COMMUNITY): Payer: Self-pay | Admitting: Emergency Medicine

## 2015-12-27 DIAGNOSIS — Z4801 Encounter for change or removal of surgical wound dressing: Secondary | ICD-10-CM | POA: Diagnosis not present

## 2015-12-27 DIAGNOSIS — F17210 Nicotine dependence, cigarettes, uncomplicated: Secondary | ICD-10-CM | POA: Diagnosis not present

## 2015-12-27 DIAGNOSIS — Z5189 Encounter for other specified aftercare: Secondary | ICD-10-CM

## 2015-12-27 HISTORY — DX: Cutaneous abscess, unspecified: L02.91

## 2015-12-27 NOTE — ED Triage Notes (Signed)
Pt comes from home with a follow up for a abscess under his right arm. I&D'd two days ago.

## 2015-12-27 NOTE — ED Notes (Signed)
Pt ambulatory and independent at discharge.  Verbalized understanding of discharge instructions 

## 2015-12-27 NOTE — Discharge Instructions (Signed)
Please read attached information. If you experience any new or worsening signs or symptoms please return to the emergency room for evaluation. Please follow-up with your primary care provider or specialist as discussed.  °

## 2015-12-27 NOTE — ED Provider Notes (Signed)
[facility]-EMER[facility]Y DEPT Provider Note   CSN: [id number] Arrival date & time: 12/27/15  1451  By signing my name below, I, Teofilo PodMatthew P. Jamison, attest that this documentation has been prepared under the direction and in the presence of Newell RubbermaidJeffrey Wadsworth Skolnick, PA-C. Electronically Signed: Teofilo PodMatthew P. Jamison, ED Scribe. 12/27/2015. 3:08 PM.   History   Chief Complaint Chief Complaint  Patient presents with  . Wound Check   The history is provided by the patient. No language interpreter was used.   HPI Comments:  Wesley Best is a 40 y.o. male who presents to the Emergency Department, here for an abscess evaluation. Pt had an I&D with packing for an abscess to his right axilla here 2 days ago. Pt reports that the abscess has been improving. Pt denies fever.   Past Medical History:  Diagnosis Date  . Abscess     There are no active problems to display for this patient.   Past Surgical History:  Procedure Laterality Date  . FOOT SURGERY    . HERNIA REPAIR         Home Medications    Prior to Admission medications   Medication Sig Start Date End Date Taking? Authorizing Provider  nystatin cream (MYCOSTATIN) Apply 1 application topically 2 (two) times daily. Treat for 7 days 08/12/14   Ozella Rocksavid J Merrell, MD  permethrin (ELIMITE) 5 % cream Apply topically to entire body and leave on overnight. Wash off in the morning and reapply in 14 days 08/12/14   Ozella Rocksavid J Merrell, MD    Family History Family History  Problem Relation Age of Onset  . Diabetes Neg Hx   . Heart failure Neg Hx     Social History Social History  Substance Use Topics  . Smoking status: Current Every Day Smoker    Packs/day: 0.50    Types: Cigarettes  . Smokeless tobacco: Never Used  . Alcohol use Yes     Comment: Pt reports drinking 2 - 40 oz per day     Allergies   Penicillins   Review of Systems Review of Systems 10 systems reviewed and all are negative for acute change except as noted in the  HPI.    Physical Exam Updated Vital Signs BP (!) 141/111 (BP Location: Left Arm)   Pulse 79   Temp 98 F (36.7 C) (Oral)   Resp 19   Ht 6\' 2"  (1.88 m)   Wt 77.1 kg   SpO2 100%   BMI 21.83 kg/m   Physical Exam  Constitutional: He appears well-developed and well-nourished. No distress.  HENT:  Head: Normocephalic and atraumatic.  Eyes: Conjunctivae are normal.  Cardiovascular: Normal rate.   Pulmonary/Chest: Effort normal.  Abdominal: He exhibits no distension.  Neurological: He is alert.  Skin: Skin is warm and dry.  Healing abscess to the right axilla; no sign of cellulitis   Psychiatric: He has a normal mood and affect.  Nursing note and vitals reviewed.    ED Treatments / Results  DIAGNOSTIC STUDIES:  Oxygen Saturation is 100% on RA, normal by my interpretation.    COORDINATION OF CARE:  3:08 PM Discussed treatment plan with pt at bedside and pt agreed to plan.   Labs (all labs ordered are listed, but only abnormal results are displayed) Labs Reviewed - No data to display  EKG  EKG Interpretation None       Radiology No results found.  Procedures Procedures (including critical care time)  Medications Ordered in ED Medications - No  data to display   Initial Impression / Assessment and Plan / ED Course  I have reviewed the triage vital signs and the nursing notes.  Pertinent labs & imaging results that were available during my care of the patient were reviewed by me and considered in my medical decision making (see chart for details).  Clinical Course       Final Clinical Impressions(s) / ED Diagnoses   Final diagnoses:  Visit for wound check  Labs:   Imaging:   Consults:   Therapeutics:   Discharge Meds:  Assessment/Plan:   Patient presents for wound check. Abscess appears to be healing appropriately, no signs of cellulitis, no systemic symptoms. Wound care instructions given, return precautions given. He verbalized  understanding and agreement.   New Prescriptions Discharge Medication List as of 12/27/2015  3:25 PM      I personally performed the services described in this documentation, which was scribed in my presence. The recorded information has been reviewed and is accurate.    Eyvonne MechanicJeffrey Alissa Pharr, PA-C 12/27/15 1611    Linwood DibblesJon Knapp, MD 12/30/15 [phone]

## 2016-05-07 ENCOUNTER — Encounter (HOSPITAL_COMMUNITY): Payer: Self-pay | Admitting: Emergency Medicine

## 2016-05-07 ENCOUNTER — Ambulatory Visit (HOSPITAL_COMMUNITY)
Admission: EM | Admit: 2016-05-07 | Discharge: 2016-05-07 | Disposition: A | Discharge status: Home / Self Care | Payer: Commercial Managed Care - HMO | Attending: Family Medicine | Admitting: Family Medicine

## 2016-05-07 DIAGNOSIS — R21 Rash and other nonspecific skin eruption: Secondary | ICD-10-CM | POA: Diagnosis not present

## 2016-05-07 MED ORDER — TRIAMCINOLONE ACETONIDE 0.1 % EX CREA: 1.0000 "application " | TOPICAL_CREAM | Freq: Two times a day (BID) | CUTANEOUS | 0 refills | Status: DC

## 2016-05-07 NOTE — ED Provider Notes (Signed)
CSN: [id number]     Arrival date & time 05/07/16  1242 History   First MD Initiated Contact with Patient 05/07/16 1341     Chief Complaint  Patient presents with  . Rash   (Consider location/radiation/quality/duration/timing/severity/associated sxs/prior Treatment) 41 year old male presents for evaluation of rash in his scrotal, and perineal area. Was treated one year ago for scabies, states that he is itching then resolved, however he has had increased itching over the last week. Further more he states this is a different sensation than when he was treated before.   The history is provided by the patient.  Rash  Location:  Pelvis Pelvic rash location:  Groin and scrotum Quality: redness   Quality: not blistering, not burning, not draining, not painful, not peeling and not scaling   Severity:  Moderate Onset quality:  Gradual Duration:  1 week Timing:  Intermittent Progression:  Unchanged Chronicity:  New Context: not hot tub use, not insect bite/sting, not medications and not new detergent/soap   Relieved by:  Nothing Worsened by:  Nothing Ineffective treatments:  None tried Associated symptoms: no abdominal pain, no diarrhea, no fatigue, no fever, no joint pain, no myalgias, no nausea, no shortness of breath, no sore throat, no throat swelling, no URI and not wheezing     Past Medical History:  Diagnosis Date  . Abscess    Past Surgical History:  Procedure Laterality Date  . FOOT SURGERY    . HERNIA REPAIR     Family History  Problem Relation Age of Onset  . Diabetes Neg Hx   . Heart failure Neg Hx    Social History  Substance Use Topics  . Smoking status: Current Every Day Smoker    Packs/day: 0.25    Types: Cigarettes  . Smokeless tobacco: Never Used  . Alcohol use Yes     Comment: 12 per week    Review of Systems  Reason unable to perform ROS: as covered in HPI.  Constitutional: Negative for fatigue and fever.  HENT: Negative for congestion, rhinorrhea  and sore throat.   Respiratory: Negative for shortness of breath and wheezing.   Cardiovascular: Negative for chest pain, palpitations and leg swelling.  Gastrointestinal: Negative for abdominal pain, constipation, diarrhea and nausea.  Genitourinary: Negative for discharge, dysuria, flank pain, frequency, genital sores, hematuria, penile pain, penile swelling, scrotal swelling and urgency.  Musculoskeletal: Negative for arthralgias and myalgias.  Skin: Positive for rash.  Neurological: Negative for dizziness, syncope and weakness.  Hematological: Negative for adenopathy.  All other systems reviewed and are negative.   Allergies  Penicillins and Shellfish allergy  Home Medications   Prior to Admission medications   Medication Sig Start Date End Date Taking? Authorizing Provider  triamcinolone cream (KENALOG) 0.1 % Apply 1 application topically 2 (two) times daily. 05/07/16   Dorena BodoLawrence Dijon Cosens, NP   Meds Ordered and Administered this Visit  Medications - No data to display  BP (!) 155/98 (BP Location: Left Arm)   Pulse 72   Temp 97.8 F (36.6 C) (Oral)   Resp 16   Ht 6\' 2"  (1.88 m)   Wt 178 lb (80.7 kg)   SpO2 98%   BMI 22.85 kg/m  No data found.   Physical Exam  Constitutional: He is oriented to person, place, and time. He appears well-developed and well-nourished. No distress.  Abdominal: Soft. Bowel sounds are normal. He exhibits no distension. There is no tenderness. There is no guarding.  Genitourinary: Testes normal and penis normal.  Right testis shows no mass and no tenderness. Left testis shows no mass and no tenderness. Circumcised.  Genitourinary Comments: Erythremic macular regions scrotum, in the groin bilaterally, and perineal area. No evidence of nits, no insect infestation seen, rash does not appear consistent with a tinea infection, one small open wound noted approximately 1 mm in diameter, on the left lateral part of the scrotum. HSV culture collected.   Lymphadenopathy: No inguinal adenopathy noted on the right or left side.  Neurological: He is alert and oriented to person, place, and time.  Skin: Skin is warm and dry. Capillary refill takes less than 2 seconds. He is not diaphoretic.  Psychiatric: He has a normal mood and affect.  Nursing note and vitals reviewed.   Urgent Care Course     Procedures (including critical care time)  Labs Review Labs Reviewed  HSV CULTURE AND TYPING    Imaging Review No results found.      MDM   1. Rash     HSV cultures collected, patient prescribed triamcinolone cream for itch, he'll be notified of the results. 5 business days, return to clinic in 1 week if symptoms fail to resolve.    Dorena Bodo, NP 05/07/16 [phone]

## 2016-05-07 NOTE — ED Triage Notes (Signed)
Pt reports an itching rash over groin area for a year. Rash has come and gone. PT reports current rash has lasted for a month. PT has tried hydrocortisone cream.

## 2016-05-07 NOTE — Discharge Instructions (Signed)
I see no evidence of scabies, body lice, or other insect infestation. Your rash is not consistent with fungal infection either, I prescribed a steroid cream, apply to the affected area twice a day as needed. He'll be notified of your test results in 3-5 business days, and if positive a medication will be called in for you.

## 2016-05-10 LAB — HSV CULTURE AND TYPING

## 2017-07-23 DIAGNOSIS — L309 Dermatitis, unspecified: Secondary | ICD-10-CM | POA: Diagnosis not present

## 2017-07-23 DIAGNOSIS — B359 Dermatophytosis, unspecified: Secondary | ICD-10-CM | POA: Diagnosis not present

## 2017-12-15 ENCOUNTER — Encounter (HOSPITAL_COMMUNITY): Payer: Self-pay

## 2017-12-15 ENCOUNTER — Emergency Department (HOSPITAL_COMMUNITY): Payer: 59

## 2017-12-15 ENCOUNTER — Emergency Department (HOSPITAL_COMMUNITY)
Admission: EM | Admit: 2017-12-15 | Discharge: 2017-12-15 | Disposition: A | Discharge status: Home / Self Care | Payer: 59 | Attending: Emergency Medicine | Admitting: Emergency Medicine

## 2017-12-15 DIAGNOSIS — R55 Syncope and collapse: Secondary | ICD-10-CM | POA: Insufficient documentation

## 2017-12-15 DIAGNOSIS — F17210 Nicotine dependence, cigarettes, uncomplicated: Secondary | ICD-10-CM | POA: Insufficient documentation

## 2017-12-15 DIAGNOSIS — R42 Dizziness and giddiness: Secondary | ICD-10-CM | POA: Diagnosis not present

## 2017-12-15 DIAGNOSIS — B356 Tinea cruris: Secondary | ICD-10-CM

## 2017-12-15 DIAGNOSIS — R079 Chest pain, unspecified: Secondary | ICD-10-CM | POA: Diagnosis not present

## 2017-12-15 LAB — CBC
HCT: 43.4 % (ref 39.0–52.0)
Hemoglobin: 14.7 g/dL (ref 13.0–17.0)
MCH: 32.4 pg (ref 26.0–34.0)
MCHC: 33.9 g/dL (ref 30.0–36.0)
MCV: 95.6 fL (ref 80.0–100.0)
PLATELETS: 271 10*3/uL (ref 150–400)
RBC: 4.54 MIL/uL (ref 4.22–5.81)
RDW: 12.3 % (ref 11.5–15.5)
WBC: 7.2 10*3/uL (ref 4.0–10.5)
nRBC: 0 % (ref 0.0–0.2)

## 2017-12-15 LAB — BASIC METABOLIC PANEL
Anion gap: 17 — ABNORMAL HIGH (ref 5–15)
BUN: 12 mg/dL (ref 6–20)
CALCIUM: 9.7 mg/dL (ref 8.9–10.3)
CO2: 20 mmol/L — ABNORMAL LOW (ref 22–32)
Chloride: 102 mmol/L (ref 98–111)
Creatinine, Ser: 0.91 mg/dL (ref 0.61–1.24)
GFR calc Af Amer: >60 mL/min (ref >60)
GLUCOSE: 131 mg/dL — AB (ref 70–99)
Potassium: 3.4 mmol/L — ABNORMAL LOW (ref 3.5–5.1)
Sodium: 139 mmol/L (ref 135–145)

## 2017-12-15 LAB — HEPATIC FUNCTION PANEL
ALT: 29 U/L (ref 0–44)
AST: 35 U/L (ref 15–41)
Albumin: 4.2 g/dL (ref 3.5–5.0)
Alkaline Phosphatase: 51 U/L (ref 38–126)
BILIRUBIN TOTAL: 0.9 mg/dL (ref 0.3–1.2)
Bilirubin, Direct: 0.2 mg/dL (ref 0.0–0.2)
Indirect Bilirubin: 0.7 mg/dL (ref 0.3–0.9)
Total Protein: 7.3 g/dL (ref 6.5–8.1)

## 2017-12-15 LAB — MAGNESIUM: MAGNESIUM: 2.5 mg/dL — AB (ref 1.7–2.4)

## 2017-12-15 LAB — I-STAT TROPONIN, ED: TROPONIN I, POC: 0 ng/mL (ref 0.00–0.08)

## 2017-12-15 MED ORDER — SODIUM CHLORIDE 0.9 % IV BOLUS
1000.0000 mL | Freq: Once | INTRAVENOUS | Status: AC
  Administered 2017-12-15: 1000 mL via INTRAVENOUS

## 2017-12-15 MED ORDER — POTASSIUM CHLORIDE CRYS ER 20 MEQ PO TBCR: 20.0000 meq | EXTENDED_RELEASE_TABLET | Freq: Two times a day (BID) | ORAL | 0 refills | Status: DC

## 2017-12-15 MED ORDER — POTASSIUM CHLORIDE CRYS ER 20 MEQ PO TBCR
40.0000 meq | EXTENDED_RELEASE_TABLET | Freq: Once | ORAL | Status: AC
  Administered 2017-12-15: 40 meq via ORAL
  Filled 2017-12-15: qty 2

## 2017-12-15 MED ORDER — FLUCONAZOLE 150 MG PO TABS: 150.0000 mg | ORAL_TABLET | ORAL | 0 refills | Status: AC

## 2017-12-15 NOTE — ED Provider Notes (Signed)
[facility] EMERGENCY DEPARTMENT Provider Note   CSN: [id number] Arrival date & time: 12/15/17  0225     History   Chief Complaint Chief Complaint  Patient presents with  . Chest Pain    HPI Wesley Best is a 42 y.o. male.  The history is provided by the patient.  He has noted rapid heart rate for the last 3 days, and had a syncopal episodes tonight.  He noted that he felt lightheaded and then fell to the floor.  Family states that he was unconscious for 5 minutes.  There was no incontinence and no seizure activity and he knew where he was when he woke up.  He denies chest pain, heaviness, tightness, pressure.  He has had a few episodes where he felt like his heart had skipped a beat.  He also relates that he has had multiple readings of elevated blood pressure but has never been put on medication.  He states his father recently had a stroke.  He does smoke 1 pack of cigarettes a day, drinks 80 ounces of beer a day but denies drug use.  Past Medical History:  Diagnosis Date  . Abscess     There are no active problems to display for this patient.   Past Surgical History:  Procedure Laterality Date  . FOOT SURGERY    . HERNIA REPAIR          Home Medications    Prior to Admission medications   Medication Sig Start Date End Date Taking? Authorizing Provider  triamcinolone cream (KENALOG) 0.1 % Apply 1 application topically 2 (two) times daily. 05/07/16   Dorena Bodo, NP    Family History Family History  Problem Relation Age of Onset  . Diabetes Neg Hx   . Heart failure Neg Hx     Social History Social History   Tobacco Use  . Smoking status: Current Every Day Smoker    Packs/day: 0.25    Types: Cigarettes  . Smokeless tobacco: Never Used  Substance Use Topics  . Alcohol use: Yes    Comment: 12 per week  . Drug use: No     Allergies   Penicillins and Shellfish allergy   Review of Systems Review of Systems  All other systems  reviewed and are negative.    Physical Exam Updated Vital Signs BP 124/87 (BP Location: Right Arm)   Pulse 80   Resp 16   SpO2 98%   Physical Exam  Nursing note and vitals reviewed.  42 year old male, resting comfortably and in no acute distress. Vital signs are normal. Oxygen saturation is 98%, which is normal. Head is normocephalic and atraumatic. PERRLA, EOMI. Oropharynx is clear.  There are no carotid bruits. Neck is nontender and supple without adenopathy or JVD. Back is nontender and there is no CVA tenderness. Lungs are clear without rales, wheezes, or rhonchi. Chest is nontender. Heart has regular rate and rhythm without murmur. Abdomen is soft, flat, nontender without masses or hepatosplenomegaly and peristalsis is normoactive. Extremities have no cyanosis or edema, full range of motion is present.  Venous stasis changes are present bilaterally. Skin is warm and dry without rash. Neurologic: Mental status is normal, cranial nerves are intact, there are no motor or sensory deficits.  ED Treatments / Results  Labs (all labs ordered are listed, but only abnormal results are displayed) Labs Reviewed  BASIC METABOLIC PANEL - Abnormal; Notable for the following components:      Result Value  Potassium 3.4 (*)    CO2 20 (*)    Glucose, Bld 131 (*)    Anion gap 17 (*)    All other components within normal limits  MAGNESIUM - Abnormal; Notable for the following components:   Magnesium 2.5 (*)    All other components within normal limits  CBC  HEPATIC FUNCTION PANEL  I-STAT TROPONIN, ED    EKG EKG Interpretation  Date/Time:  Wednesday December 15 2017 02:27:13 EDT Ventricular Rate:  103 PR Interval:  170 QRS Duration: 92 QT Interval:  352 QTC Calculation: 461 R Axis:   -109 Text Interpretation:  Sinus tachycardia Biatrial enlargement Right superior axis deviation Pulmonary disease pattern Abnormal ECG When compared with ECG of 09/08/2013, No significant change was  found Confirmed by Dione Booze ([id number]) on 12/15/2017 3:20:52 AM   Radiology Dg Chest 2 View  Result Date: 12/15/2017 CLINICAL DATA:  42 year old male with chest pain. EXAM: CHEST - 2 VIEW COMPARISON:  Chest radiograph dated 09/08/2013 FINDINGS: The heart size and mediastinal contours are within normal limits. Both lungs are clear. The visualized skeletal structures are unremarkable. IMPRESSION: No active cardiopulmonary disease. Electronically Signed   By: Elgie Collard M.D.   On: 12/15/2017 03:00    Procedures Procedures  Medications Ordered in ED Medications  sodium chloride 0.9 % bolus 1,000 mL (has no administration in time range)  potassium chloride SA (K-DUR,KLOR-CON) CR tablet 40 mEq (has no administration in time range)     Initial Impression / Assessment and Plan / ED Course  I have reviewed the triage vital signs and the nursing notes.  Pertinent labs & imaging results that were available during my care of the patient were reviewed by me and considered in my medical decision making (see chart for details).  Subjective palpitations.  Monitor has shown sinus rhythm at normal rate while patient has felt like his heart was racing.  Syncope without red flags to suggest serious causes.  He is able to be cleared by [facility] syncope rules.  Old records are reviewed, and he has had ED visits with mildly elevated blood pressure, but blood pressure is normal today.  Labs do show mild metabolic acidosis with borderline anion gap and borderline hypokalemia.  Glucose is 131 and will need to be followed as an outpatient.  He will be given IV fluids, oral potassium.  Will check magnesium and hepatic functions.  Anticipate need for outpatient work-up.  Hepatic function studies are normal and magnesium is actually slightly high.  He feels better after getting IV fluids.  He is also complaining of jock itch which is been present for a long time and has been resistant to topical  antifungals.  On exam, he has thickening of the scrotum consistent with tinea cruris.  He is discharged with prescriptions for K-Dur and fluconazole, referred back to PCP for further outpatient work-up.  Final Clinical Impressions(s) / ED Diagnoses   Final diagnoses:  Syncope, unspecified syncope type  Tinea cruris    ED Discharge Orders         Ordered    potassium chloride SA (K-DUR,KLOR-CON) 20 MEQ tablet  2 times daily     12/15/17 0623    fluconazole (DIFLUCAN) 150 MG tablet  Weekly     12/15/17 0623           Dione Booze, MD 12/15/17 [phone]

## 2017-12-15 NOTE — Discharge Instructions (Signed)
Return if you are having any problems. 

## 2017-12-15 NOTE — ED Triage Notes (Signed)
Pt reports that for the past three days he has been having palpations and central CP, reports he had a syncopal episode today. Denies hitting head or injury, reports also having headache.

## 2018-03-19 ENCOUNTER — Other Ambulatory Visit: Payer: Self-pay

## 2018-03-19 ENCOUNTER — Emergency Department (HOSPITAL_COMMUNITY)
Admission: EM | Admit: 2018-03-19 | Discharge: 2018-03-19 | Disposition: A | Discharge status: Home / Self Care | Payer: 59 | Attending: Emergency Medicine | Admitting: Emergency Medicine

## 2018-03-19 ENCOUNTER — Encounter (HOSPITAL_COMMUNITY): Payer: Self-pay | Admitting: Emergency Medicine

## 2018-03-19 DIAGNOSIS — R112 Nausea with vomiting, unspecified: Secondary | ICD-10-CM | POA: Insufficient documentation

## 2018-03-19 DIAGNOSIS — F17210 Nicotine dependence, cigarettes, uncomplicated: Secondary | ICD-10-CM | POA: Diagnosis not present

## 2018-03-19 DIAGNOSIS — R197 Diarrhea, unspecified: Secondary | ICD-10-CM | POA: Diagnosis not present

## 2018-03-19 DIAGNOSIS — Z79899 Other long term (current) drug therapy: Secondary | ICD-10-CM | POA: Diagnosis not present

## 2018-03-19 MED ORDER — ONDANSETRON 4 MG PO TBDP: 4.0000 mg | ORAL_TABLET | Freq: Three times a day (TID) | ORAL | 0 refills | Status: DC | PRN

## 2018-03-19 MED ORDER — ONDANSETRON 4 MG PO TBDP
4.0000 mg | ORAL_TABLET | Freq: Once | ORAL | Status: AC
  Administered 2018-03-19: 4 mg via ORAL
  Filled 2018-03-19: qty 1

## 2018-03-19 NOTE — ED Provider Notes (Signed)
[facility] Provider Note   CSN: [id number] Arrival date & time: 03/19/18  1134     History   Chief Complaint Chief Complaint  Patient presents with  . Emesis  . Weakness    HPI Wesley Best is a 43 y.o. male without significant PMHx, presenting to the emergency department with nausea and vomiting that began this morning.  Patient states he has had a few episodes of nonbloody nonbilious emesis today.  He has associated mild epigastric/periumbilical abdominal pain described as a "balled up feeling."  States now his pain is more of a hunger feeling.  States 2 days ago he had nonbloody diarrhea.  He has felt somewhat weak as well.  Denies fevers, chills, urinary symptoms, constipation.  No history of abdominal surgery.  No medications or interventions tried prior to arrival.  No sick contacts.  The history is provided by the patient.    Past Medical History:  Diagnosis Date  . Abscess     There are no active problems to display for this patient.   Past Surgical History:  Procedure Laterality Date  . FOOT SURGERY    . HERNIA REPAIR          Home Medications    Prior to Admission medications   Medication Sig Start Date End Date Taking? Authorizing Provider  ondansetron (ZOFRAN ODT) 4 MG disintegrating tablet Take 1 tablet (4 mg total) by mouth every 8 (eight) hours as needed for nausea or vomiting. 03/19/18   Ianna Salmela, [place] N, PA-C  potassium chloride SA (K-DUR,KLOR-CON) 20 MEQ tablet Take 1 tablet (20 mEq total) by mouth 2 (two) times daily. 12/15/17   Dione BoozeGlick, David, MD  triamcinolone cream (KENALOG) 0.1 % Apply 1 application topically 2 (two) times daily. 05/07/16   Dorena BodoKennard, Lawrence, NP    Family History Family History  Problem Relation Age of Onset  . Diabetes Neg Hx   . Heart failure Neg Hx     Social History Social History   Tobacco Use  . Smoking status: Current Every Day Smoker    Packs/day: 0.25    Types: Cigarettes   . Smokeless tobacco: Never Used  Substance Use Topics  . Alcohol use: Yes    Comment: 12 per week  . Drug use: No     Allergies   Penicillins and Shellfish allergy   Review of Systems Review of Systems  Constitutional: Negative for chills and fever.  Gastrointestinal: Positive for abdominal pain, diarrhea, nausea and vomiting. Negative for blood in stool.  Genitourinary: Negative for dysuria and frequency.  All other systems reviewed and are negative.    Physical Exam Updated Vital Signs BP (!) 152/110   Pulse 73   Temp 98.2 F (36.8 C) (Oral)   Resp 16   Ht 6\' 2"  (1.88 m)   Wt 77.1 kg   SpO2 100%   BMI 21.83 kg/m   Physical Exam Vitals signs and nursing note reviewed.  Constitutional:      General: He is not in acute distress.    Appearance: He is well-developed. He is not ill-appearing.  HENT:     Head: Normocephalic and atraumatic.  Eyes:     Conjunctiva/sclera: Conjunctivae normal.  Cardiovascular:     Rate and Rhythm: Normal rate and regular rhythm.  Pulmonary:     Effort: Pulmonary effort is normal.     Breath sounds: Normal breath sounds.  Abdominal:     General: Abdomen is flat. Bowel sounds are normal. There  is no distension.     Palpations: Abdomen is soft. There is no mass.     Tenderness: There is abdominal tenderness in the epigastric area, left upper quadrant and left lower quadrant. There is no guarding or rebound. Negative signs include Rovsing's sign and McBurney's sign.  Skin:    General: Skin is warm.  Neurological:     Mental Status: He is alert.  Psychiatric:        Behavior: Behavior normal.      ED Treatments / Results  Labs (all labs ordered are listed, but only abnormal results are displayed) Labs Reviewed - No data to display  EKG None  Radiology No results found.  Procedures Procedures (including critical care time)  Medications Ordered in ED Medications  ondansetron (ZOFRAN-ODT) disintegrating tablet 4 mg (4  mg Oral Given 03/19/18 1224)     Initial Impression / Assessment and Plan / ED Course  I have reviewed the triage vital signs and the nursing notes.  Pertinent labs & imaging results that were available during my care of the patient were reviewed by me and considered in my medical decision making (see chart for details).  Clinical Course as of Mar 20 1323  Sat Mar 19, 2018  1322 Patient reevaluated.  Reports improvement in symptoms, tolerating p.o. liquids.  Will discharge with symptomatic management and PCP follow-up.   [JR]    Clinical Course User Index [JR] Jamani Eley, [place] N, PA-C    Patient with symptoms consistent with viral gastroenteritis.  Vitals are stable, no fever.  No signs of dehydration, tolerating PO fluids > 6 oz.  Lungs are clear.  No focal abdominal pain, no concern for appendicitis, cholecystitis, pancreatitis, ruptured viscus, UTI, kidney stone, or any other abdominal etiology.  Supportive therapy indicated with return if symptoms worsen.  Strict return precautions discussed.  Discussed results, findings, treatment and follow up. Patient advised of return precautions. Patient verbalized understanding and agreed with plan.  Final Clinical Impressions(s) / ED Diagnoses   Final diagnoses:  Nausea vomiting and diarrhea    ED Discharge Orders         Ordered    ondansetron (ZOFRAN ODT) 4 MG disintegrating tablet  Every 8 hours PRN     03/19/18 1325           Krupa Stege, [place] N, [place] 03/19/18 1326    Margarita Grizzle, MD 03/19/18 [phone]

## 2018-03-19 NOTE — Discharge Instructions (Signed)
Please read instructions below. °Drink clear liquids until your stomach feels better. Then, slowly introduce bland foods into your diet as tolerated, such as bread, rice, apples, bananas. °You can take zofran every 8 hours as needed for nausea. °Follow up with your primary care if symptoms persist. °Return to the ER for severe abdominal pain, fever, uncontrollable vomiting, or new or concerning symptoms. ° °

## 2018-03-19 NOTE — ED Notes (Signed)
Patient verbalizes understanding of discharge instructions. Opportunity for questioning and answers were provided. Armband removed by staff, pt discharged from ED.  

## 2018-03-19 NOTE — ED Triage Notes (Signed)
Pt states he has been sick for 2 days and has weakness and nausea. 3/10 abdominal pain

## 2018-05-13 ENCOUNTER — Encounter: Payer: Self-pay | Admitting: Emergency Medicine

## 2018-05-13 ENCOUNTER — Ambulatory Visit
Admission: EM | Admit: 2018-05-13 | Discharge: 2018-05-13 | Disposition: A | Discharge status: Home / Self Care | Payer: 59 | Attending: Physician Assistant | Admitting: Physician Assistant

## 2018-05-13 ENCOUNTER — Other Ambulatory Visit: Payer: Self-pay

## 2018-05-13 DIAGNOSIS — R21 Rash and other nonspecific skin eruption: Secondary | ICD-10-CM

## 2018-05-13 MED ORDER — CLOTRIMAZOLE-BETAMETHASONE 1-0.05 % EX CREA: TOPICAL_CREAM | CUTANEOUS | 0 refills | Status: DC

## 2018-05-13 MED ORDER — CETIRIZINE HCL 10 MG PO TABS: 10.0000 mg | ORAL_TABLET | Freq: Every day | ORAL | 0 refills | Status: DC

## 2018-05-13 NOTE — ED Provider Notes (Signed)
[facility]    CSN: [id number] Arrival date & time: 05/13/18  1339     History   Chief Complaint Chief Complaint  Patient presents with  . Rash    HPI Berlin Filipiak is a 43 y.o. male.   43 year old male comes in for rash to the groin he states started 2 years ago. States that as itching rash, for which he has been seen multiple times for. States he has been given creams in the past that provides temporary relief.  States a few months ago was treated with Diflucan without relief.  He denies any new hygiene product changes.  Denies spreading erythema, warmth, fever.  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     Past Medical History:  Diagnosis Date  . Abscess     There are no active problems to display for this patient.   Past Surgical History:  Procedure Laterality Date  . FOOT SURGERY    . HERNIA REPAIR         Home Medications    Prior to Admission medications   Medication Sig Start Date End Date Taking? Authorizing Provider  cetirizine (ZYRTEC) 10 MG tablet Take 1 tablet (10 mg total) by mouth daily. 05/13/18   Belinda Fisher, PA-C  clotrimazole-betamethasone (LOTRISONE) cream Apply to affected area 2 times daily prn 05/13/18   Cathie Hoops, Braelyn Bordonaro V, PA-C  ondansetron (ZOFRAN ODT) 4 MG disintegrating tablet Take 1 tablet (4 mg total) by mouth every 8 (eight) hours as needed for nausea or vomiting. 03/19/18   Robinson, [place] N, PA-C    Family History Family History  Problem Relation Age of Onset  . Diabetes Neg Hx   . Heart failure Neg Hx     Social History Social History   Tobacco Use  . Smoking status: Current Every Day Smoker    Packs/day: 0.10    Types: Cigarettes  . Smokeless tobacco: Never Used  Substance Use Topics  . Alcohol use: Yes    Comment: 12 per week  . Drug use: No     Allergies   Penicillins and Shellfish allergy   Review of Systems Review of Systems  Reason unable to perform ROS:  See HPI as above.     Physical Exam Triage Vital Signs ED Triage Vitals  Enc Vitals Group     BP 05/13/18 1344 (!) 175/102     Pulse Rate 05/13/18 1344 89     Resp 05/13/18 1344 16     Temp 05/13/18 1344 98.1 F (36.7 C)     Temp Source 05/13/18 1344 Oral     SpO2 05/13/18 1344 97 %     Weight --      Height --      Head Circumference --      Peak Flow --      Pain Score 05/13/18 1346 0     Pain Loc --      Pain Edu? --      Excl. in GC? --    No data found.  Updated Vital Signs BP (!) 175/102 (BP Location: Right Arm)   Pulse 89   Temp 98.1 F (36.7 C) (Oral)   Resp 16   SpO2 97%   Physical Exam Exam conducted with a chaperone present.  Constitutional:      General: He is not in acute distress.    Appearance: He is well-developed. He is not diaphoretic.  HENT:     Head: Normocephalic and atraumatic.  Eyes:     Conjunctiva/sclera: Conjunctivae normal.     Pupils: Pupils are equal, round, and reactive to light.  Genitourinary:    Comments: Scratch marks around the groin area. Mild erythema around the groin area. No rashes or erythema to the testicle, penis. No tenderness to palpation. No testicular swelling.  Neurological:     Mental Status: He is alert and oriented to person, place, and time.      UC Treatments / Results  Labs (all labs ordered are listed, but only abnormal results are displayed) Labs Reviewed - No data to display  EKG None  Radiology No results found.  Procedures Procedures (including critical care time)  Medications Ordered in UC Medications - No data to display  Initial Impression / Assessment and Plan / UC Course  I have reviewed the triage vital signs and the nursing notes.  Pertinent labs & imaging results that were available during my care of the patient were reviewed by me and considered in my medical decision making (see chart for details).    Will provide lotrisone. Discussed avoiding soap at this time and decreasing  moisture to the area. Zyrtec as needed for itching. Refrain from scratching. Return precautions given. Patient expresses understanding and agrees to plan.  Final Clinical Impressions(s) / UC Diagnoses   Final diagnoses:  Groin rash   ED Prescriptions    Medication Sig Dispense Auth. Provider   clotrimazole-betamethasone (LOTRISONE) cream Apply to affected area 2 times daily prn 15 g Aerial Dilley V, PA-C   cetirizine (ZYRTEC) 10 MG tablet Take 1 tablet (10 mg total) by mouth daily. 30 tablet Threasa Alpha, [place] 05/13/18 1425

## 2018-05-13 NOTE — Discharge Instructions (Signed)
Start lotrisone as directed. Zyrtec as directed. Try to refrain from scratching. Monitor for spreading redness, increased warmth, increased pain, follow up for reevaluation needed.

## 2018-05-13 NOTE — ED Notes (Signed)
Patient able to ambulate independently  

## 2018-05-13 NOTE — ED Triage Notes (Addendum)
Pt presents to [facility] for assessment of rash intermittent x 2 years to groin area.  Pt states he has been seen multiple times before for it without relief.

## 2018-07-26 ENCOUNTER — Ambulatory Visit (INDEPENDENT_AMBULATORY_CARE_PROVIDER_SITE_OTHER): Payer: 59 | Admitting: Cardiology

## 2018-07-26 ENCOUNTER — Other Ambulatory Visit: Payer: Self-pay

## 2018-07-26 ENCOUNTER — Encounter: Payer: Self-pay | Admitting: Cardiology

## 2018-07-26 VITALS — BP 152/102 | HR 75 | Temp 98.7°F | Ht 74.0 in | Wt 160.0 lb

## 2018-07-26 DIAGNOSIS — E559 Vitamin D deficiency, unspecified: Secondary | ICD-10-CM

## 2018-07-26 DIAGNOSIS — R002 Palpitations: Secondary | ICD-10-CM

## 2018-07-26 DIAGNOSIS — I10 Essential (primary) hypertension: Secondary | ICD-10-CM

## 2018-07-26 DIAGNOSIS — F17200 Nicotine dependence, unspecified, uncomplicated: Secondary | ICD-10-CM

## 2018-07-26 DIAGNOSIS — E782 Mixed hyperlipidemia: Secondary | ICD-10-CM | POA: Diagnosis not present

## 2018-07-26 MED ORDER — DILTIAZEM HCL ER COATED BEADS 240 MG PO CP24: 240.0000 mg | ORAL_CAPSULE | Freq: Every day | ORAL | 2 refills | Status: DC

## 2018-07-26 NOTE — Progress Notes (Signed)
Primary Physician/Referring:  Aura Dials, MD  Patient ID: Wesley Best, male    DOB: Dec 10, 1975, 43 y.o.   MRN: [id number]  Chief Complaint  Patient presents with  . Palpitations    pt feels heart flutters for 3 months  . New Patient (Initial Visit)    HPI: Wesley Best  is a 43 y.o. male  Patient's wife made An appointment for her husband to be seen here due to episodes of palpitations that started about 3-4 months ago.   Patient describes palpitations as irregular skipped beats that occur During essentially routine chores But not during work and he is active.  No other associated symptoms.  Upon review of his records from 2014 and also 2019, patient has mixed hyperlipidemia, tobacco use disorder, severe hypovitaminosis D and hypertension that is not recognized/treated.  He is also been in the emergency room in 2019 with syncope which appears to be vasovagal and recently for skin lesion.  He is accompanied by his wife.  No syncope, no family history of sudden cardiac death.  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  Smokes about one half pack of cigarettes a day and approximately about 5 beers a day.  Past Medical History:  Diagnosis Date  . Abscess     Past Surgical History:  Procedure Laterality Date  . FOOT SURGERY    . HERNIA REPAIR    . lymph node removal      Social History   Socioeconomic History  . Marital status: Married    Spouse name: Not on file  . Number of children: 3  . Years of education: Not on file  . Highest education level: Not on file  Occupational History  . Not on file  Social Needs  . Financial resource strain: Not on file  . Food insecurity:    Worry: Not on file    Inability: Not on file  . Transportation needs:    Medical: Not on file    Non-medical: Not on file  Tobacco Use  . Smoking status: Current Every Day Smoker    Packs/day: 13.00    Types: Cigarettes    Start date:  07/26/1998  . Smokeless tobacco: Never Used  Substance and Sexual Activity  . Alcohol use: Yes    Comment: 12 per week  . Drug use: No  . Sexual activity: Not on file  Lifestyle  . Physical activity:    Days per week: Not on file    Minutes per session: Not on file  . Stress: Not on file  Relationships  . Social connections:    Talks on phone: Not on file    Gets together: Not on file    Attends religious service: Not on file    Active member of club or organization: Not on file    Attends meetings of clubs or organizations: Not on file    Relationship status: Not on file  . Intimate partner violence:    Fear of current or ex partner: Not on file    Emotionally abused: Not on file    Physically abused: Not on file    Forced sexual activity: Not on file  Other Topics Concern  . Not on file  Social History Narrative  . Not on file    Review of Systems  Constitution: Negative for chills, decreased appetite, malaise/fatigue and weight gain.  Eyes: Positive for photophobia.  Cardiovascular: Positive for palpitations. Negative for  dyspnea on exertion, leg swelling and syncope.  Endocrine: Negative for cold intolerance.  Hematologic/Lymphatic: Does not bruise/bleed easily.  Musculoskeletal: Negative for joint swelling.  Gastrointestinal: Negative for abdominal pain, anorexia, change in bowel habit, hematochezia and melena.  Neurological: Negative for headaches and light-headedness.  Psychiatric/Behavioral: Negative for depression and substance abuse.  All other systems reviewed and are negative.     Objective  Blood pressure (!) 152/102, pulse 75, temperature 98.7 F (37.1 C), height '6\' 2"'$  (1.88 m), weight 160 lb (72.6 kg), SpO2 99 %. Body mass index is 20.54 kg/m.    Physical Exam  Constitutional: He appears well-developed and well-nourished. No distress.  HENT:  Head: Atraumatic.  Eyes: Conjunctivae are normal.  Neck: Neck supple. No JVD present. No thyromegaly present.   Cardiovascular: Normal rate, regular rhythm, normal heart sounds and intact distal pulses. Exam reveals no gallop.  No murmur heard. Pulmonary/Chest: Effort normal and breath sounds normal.  Abdominal: Soft. Bowel sounds are normal.  Musculoskeletal: Normal range of motion.  Neurological: He is alert.  Skin: Skin is warm and dry.  Psychiatric: He has a normal mood and affect.  Radiology: No results found.  Laboratory examination:    CMP Latest Ref Rng & Units 12/15/2017 09/08/2013 09/20/2007  Glucose 70 - 99 mg/dL 131(H) 148(H) 94  BUN 6 - 20 mg/dL '12 14 10  '$ Creatinine 0.61 - 1.24 mg/dL 0.91 0.92 1.28  Sodium 135 - 145 mmol/L 139 140 137  Potassium 3.5 - 5.1 mmol/L 3.4(L) 3.9 4.9  Chloride 98 - 111 mmol/L 102 103 102  CO2 22 - 32 mmol/L 20(L) 21 25  Calcium 8.9 - 10.3 mg/dL 9.7 9.2 9.9  Total Protein 6.5 - 8.1 g/dL 7.3 - 7.7  Total Bilirubin 0.3 - 1.2 mg/dL 0.9 - 1.2  Alkaline Phos 38 - 126 U/L 51 - 60  AST 15 - 41 U/L 35 - 36  ALT 0 - 44 U/L 29 - 34   CBC Latest Ref Rng & Units 12/15/2017 09/08/2013 09/20/2007  WBC 4.0 - 10.5 K/uL 7.2 4.7 7.0  Hemoglobin 13.0 - 17.0 g/dL 14.7 15.5 15.8  Hematocrit 39.0 - 52.0 % 43.4 44.1 45.2  Platelets 150 - 400 K/uL 271 275 294    PRN Meds:. Medications Discontinued During This Encounter  Medication Reason  . clotrimazole-betamethasone (LOTRISONE) cream Error  . ondansetron (ZOFRAN ODT) 4 MG disintegrating tablet Error  . cetirizine (ZYRTEC) 10 MG tablet Error   No outpatient medications have been marked as taking for the 07/26/18 encounter (Office Visit) with Adrian Prows, MD.    Cardiac Studies:   None  Assessment   Palpitations - Plan: EKG 12-Lead, PCV ECHOCARDIOGRAM COMPLETE  Essential hypertension - Plan: diltiazem (CARDIZEM CD) 240 MG 24 hr capsule, TSH, CMP14+EGFR, CBC, PCV ECHOCARDIOGRAM COMPLETE  Hypovitaminosis D - Plan: Vitamin D 1,25 dihydroxy  Mixed hyperlipidemia - Plan: Lipid Panel With LDL/HDL Ratio  Tobacco use  disorder  EKG 07/26/2018: Normal sinus rhythm at rate of 79 bpm, right atrial enlargement, left axis deviation.  Incomplete right bundle branch block.  Recommendations:   Patient's wife made An appointment for her husband to be seen here due to episodes of palpitations that started about 3-4 months ago.  Symptoms are suggestive PVCs and PACs.  I'm concerned about rate atrial abnormality and right atrial strain noted on the EKG although she only smokes about one half packs of cigarette a day.  Smoking cessation was discussed.  He is clearly hypertensive, he has had multiple  emergency room visits for nonurgent issues and also to PCP, blood pressure has always been high.  I started him on diltiazem CD 240 mg daily. I have discussed with him regarding right atrial abnormality noted on EKG which will be nonspecific but may be related to his history of tobacco use.  On review of his labs from 2014, he already had mixed hyperlipidemia and hypovitaminosis D with vitamin D of 13.  We will obtain lipid profile testing along with vitamin D, also check TSH and CBC and CMP as a baseline.  I discussed with him regarding decreasing alcohol intake, he is approximately drinking about 5 cans of beer a day.  Would like to see him back in 6 weeks for follow-up.  He appears to be motivated.  Adrian Prows, MD, [facility] 07/26/2018, 10:33 PM [facility] [place] Pager: [phone] Office: [phone] If no answer Cell [phone]

## 2018-09-02 ENCOUNTER — Other Ambulatory Visit: Payer: Self-pay

## 2018-09-02 ENCOUNTER — Ambulatory Visit (INDEPENDENT_AMBULATORY_CARE_PROVIDER_SITE_OTHER): Payer: 59

## 2018-09-02 DIAGNOSIS — R002 Palpitations: Secondary | ICD-10-CM | POA: Diagnosis not present

## 2018-09-02 DIAGNOSIS — I10 Essential (primary) hypertension: Secondary | ICD-10-CM | POA: Diagnosis not present

## 2018-09-05 LAB — CBC
Hematocrit: 42.9 % (ref 37.5–51.0)
Hemoglobin: 15.8 g/dL (ref 13.0–17.7)
MCH: 34.9 pg — ABNORMAL HIGH (ref 26.6–33.0)
MCHC: 36.8 g/dL — ABNORMAL HIGH (ref 31.5–35.7)
MCV: 95 fL (ref 79–97)
Platelets: 272 10*3/uL (ref 150–450)
RBC: 4.53 x10E6/uL (ref 4.14–5.80)
RDW: 12.6 % (ref 11.6–15.4)
WBC: 5.6 10*3/uL (ref 3.4–10.8)

## 2018-09-05 LAB — CMP14+EGFR
ALT: 24 IU/L (ref 0–44)
AST: 31 IU/L (ref 0–40)
Albumin/Globulin Ratio: 2 (ref 1.2–2.2)
Albumin: 4.8 g/dL (ref 4.0–5.0)
Alkaline Phosphatase: 58 IU/L (ref 39–117)
BUN/Creatinine Ratio: 18 (ref 9–20)
BUN: 16 mg/dL (ref 6–24)
Bilirubin Total: 0.2 mg/dL (ref 0.0–1.2)
CO2: 21 mmol/L (ref 20–29)
Calcium: 9.4 mg/dL (ref 8.7–10.2)
Chloride: 100 mmol/L (ref 96–106)
Creatinine, Ser: 0.9 mg/dL (ref 0.76–1.27)
GFR calc Af Amer: 122 mL/min/{1.73_m2} (ref >59)
GFR calc non Af Amer: 106 mL/min/{1.73_m2} (ref >59)
Globulin, Total: 2.4 g/dL (ref 1.5–4.5)
Glucose: 89 mg/dL (ref 65–99)
Potassium: 4.6 mmol/L (ref 3.5–5.2)
Sodium: 140 mmol/L (ref 134–144)
Total Protein: 7.2 g/dL (ref 6.0–8.5)

## 2018-09-05 LAB — TSH: TSH: 2.75 u[IU]/mL (ref 0.450–4.500)

## 2018-09-05 LAB — VITAMIN D 1,25 DIHYDROXY
Vitamin D 1, 25 (OH)2 Total: 47 pg/mL
Vitamin D2 1, 25 (OH)2: <10 pg/mL
Vitamin D3 1, 25 (OH)2: 46 pg/mL

## 2018-09-05 LAB — LIPID PANEL WITH LDL/HDL RATIO
Cholesterol, Total: 266 mg/dL — ABNORMAL HIGH (ref 100–199)
HDL: 76 mg/dL (ref >39)
LDL Calculated: 131 mg/dL — ABNORMAL HIGH (ref 0–99)
LDl/HDL Ratio: 1.7 ratio (ref 0.0–3.6)
Triglycerides: 295 mg/dL — ABNORMAL HIGH (ref 0–149)
VLDL Cholesterol Cal: 59 mg/dL — ABNORMAL HIGH (ref 5–40)

## 2018-09-09 ENCOUNTER — Other Ambulatory Visit: Payer: Self-pay

## 2018-09-09 ENCOUNTER — Ambulatory Visit (INDEPENDENT_AMBULATORY_CARE_PROVIDER_SITE_OTHER): Payer: 59 | Admitting: Cardiology

## 2018-09-09 ENCOUNTER — Encounter: Payer: Self-pay | Admitting: Cardiology

## 2018-09-09 VITALS — BP 144/97 | HR 86 | Temp 98.4°F | Ht 74.0 in | Wt 163.0 lb

## 2018-09-09 DIAGNOSIS — E782 Mixed hyperlipidemia: Secondary | ICD-10-CM | POA: Diagnosis not present

## 2018-09-09 DIAGNOSIS — I10 Essential (primary) hypertension: Secondary | ICD-10-CM

## 2018-09-09 DIAGNOSIS — R002 Palpitations: Secondary | ICD-10-CM | POA: Diagnosis not present

## 2018-09-09 DIAGNOSIS — F17200 Nicotine dependence, unspecified, uncomplicated: Secondary | ICD-10-CM

## 2018-09-09 DIAGNOSIS — E559 Vitamin D deficiency, unspecified: Secondary | ICD-10-CM | POA: Diagnosis not present

## 2018-09-09 MED ORDER — DILTIAZEM HCL ER COATED BEADS 240 MG PO CP24: 240.0000 mg | ORAL_CAPSULE | Freq: Every day | ORAL | 2 refills | Status: DC

## 2018-09-09 MED ORDER — DILTIAZEM HCL ER COATED BEADS 240 MG PO CP24: 240.0000 mg | ORAL_CAPSULE | Freq: Every evening | ORAL | 2 refills | Status: DC

## 2018-09-09 MED ORDER — LISINOPRIL-HYDROCHLOROTHIAZIDE 20-12.5 MG PO TABS: 1.0000 | ORAL_TABLET | Freq: Every morning | ORAL | 2 refills | Status: DC

## 2018-09-09 NOTE — Progress Notes (Signed)
Primary Physician/Referring:  Henrine Screwshacker, Robert, MD  Patient ID: Wesley Best Best, male    DOB: Jun 29, 1975, 43 y.o.   MRN: [id number]  Chief Complaint  Patient presents with  . Palpitations    echo results, follw up  . Hypertension   HPI:    HPI: Wesley Best Kauzlarich  is a 43 y.o.  Patient's wife made An appointment for her husband to be seen here due to episodes of palpitations that started  Few months ago.   Patient describes palpitations as irregular skipped beats that occur During essentially routine chores But not during work and he is active.  No other associated symptoms.  He is accompanied by his wife.  No syncope, no family history of sudden cardiac death.  He works as a Merchandiser, retailsupervisor for a [employer] and is very active during work. States that since starting diltiazem, palpitation symptoms improved.  He did not with to peripheral prescription for the past 3 days.   Past Medical History:  Diagnosis Date  . Abscess    Past Surgical History:  Procedure Laterality Date  . FOOT SURGERY    . HERNIA REPAIR    . lymph node removal     Social History   Socioeconomic History  . Marital status: Married    Spouse name: Not on file  . Number of children: 3  . Years of education: Not on file  . Highest education level: Not on file  Occupational History  . Not on file  Social Needs  . Financial resource strain: Not on file  . Food insecurity    Worry: Not on file    Inability: Not on file  . Transportation needs    Medical: Not on file    Non-medical: Not on file  Tobacco Use  . Smoking status: Current Every Day Smoker    Packs/day: 13.00    Types: Cigarettes    Start date: 07/26/1998  . Smokeless tobacco: Never Used  Substance and Sexual Activity  . Alcohol use: Yes    Comment: 12 per week  . Drug use: No  . Sexual activity: Not on file  Lifestyle  . Physical activity    Days per week: Not on file    Minutes per session: Not on file  . Stress: Not on file   Relationships  . Social Musicianconnections    Talks on phone: Not on file    Gets together: Not on file    Attends religious service: Not on file    Active member of club or organization: Not on file    Attends meetings of clubs or organizations: Not on file    Relationship status: Not on file  . Intimate partner violence    Fear of current or ex partner: Not on file    Emotionally abused: Not on file    Physically abused: Not on file    Forced sexual activity: Not on file  Other Topics Concern  . Not on file  Social History Narrative  . Not on file   ROS  Review of Systems  Constitution: Negative for chills, decreased appetite, malaise/fatigue and weight gain.  Eyes: Positive for photophobia.  Cardiovascular: Positive for palpitations. Negative for dyspnea on exertion, leg swelling and syncope.  Endocrine: Negative for cold intolerance.  Hematologic/Lymphatic: Does not bruise/bleed easily.  Musculoskeletal: Negative for joint swelling.  Gastrointestinal: Negative for abdominal pain, anorexia, change in bowel habit, hematochezia and melena.  Neurological: Negative for headaches and light-headedness.  Psychiatric/Behavioral: Negative for  depression and substance abuse.  All other systems reviewed and are negative.  Objective  There were no vitals taken for this visit. There is no height or weight on file to calculate BMI.   Physical Exam  Constitutional: He appears well-developed and well-nourished. No distress.  HENT:  Head: Atraumatic.  Eyes: Conjunctivae are normal.  Neck: Neck supple. No JVD present. No thyromegaly present.  Cardiovascular: Normal rate, regular rhythm, normal heart sounds and intact distal pulses. Exam reveals no gallop.  No murmur heard. Pulmonary/Chest: Effort normal and breath sounds normal.  Abdominal: Soft. Bowel sounds are normal.  Musculoskeletal: Normal range of motion.  Neurological: He is alert.  Skin: Skin is warm and dry.  Psychiatric: He has  a normal mood and affect.   Radiology: No results found.  Laboratory examination:   CMP Latest Ref Rng & Units 08/31/2018 12/15/2017 09/08/2013  Glucose 65 - 99 mg/dL 89 131(H) 148(H)  BUN 6 - 24 mg/dL 16 12 14   Creatinine 0.76 - 1.27 mg/dL 0.90 0.91 0.92  Sodium 134 - 144 mmol/L 140 139 140  Potassium 3.5 - 5.2 mmol/L 4.6 3.4(L) 3.9  Chloride 96 - 106 mmol/L 100 102 103  CO2 20 - 29 mmol/L 21 20(L) 21  Calcium 8.7 - 10.2 mg/dL 9.4 9.7 9.2  Total Protein 6.0 - 8.5 g/dL 7.2 7.3 -  Total Bilirubin 0.0 - 1.2 mg/dL 0.2 0.9 -  Alkaline Phos 39 - 117 IU/L 58 51 -  AST 0 - 40 IU/L 31 35 -  ALT 0 - 44 IU/L 24 29 -   CBC Latest Ref Rng & Units 08/31/2018 12/15/2017 09/08/2013  WBC 3.4 - 10.8 x10E3/uL 5.6 7.2 4.7  Hemoglobin 13.0 - 17.7 g/dL 15.8 14.7 15.5  Hematocrit 37.5 - 51.0 % 42.9 43.4 44.1  Platelets 150 - 450 x10E3/uL 272 271 275   Lipid Panel     Component Value Date/Time   CHOL 266 (H) 08/31/2018 1335   TRIG 295 (H) 08/31/2018 1335   HDL 76 08/31/2018 1335   LDLCALC 131 (H) 08/31/2018 1335   HEMOGLOBIN A1C No results found for: HGBA1C, MPG TSH Recent Labs    08/31/18 1335  TSH 2.750   Vitamin D 1, 25 (OH)2 Total 08/31/2018 pg/mL 47   Comment: Reference Range:  Adults: 21 - 65     Medications   Current Outpatient Medications  Medication Instructions  . diltiazem (CARDIZEM CD) 240 mg, Oral, Daily    Cardiac Studies:   Echocardiogram 09/02/2018: Normal LV systolic function with EF 70%. Left ventricle cavity is normal in size. Mild concentric hypertrophy of the left ventricle. Normal global wall motion. Normal diastolic filling pattern.  No significant valvular abnormalities.  Inadequate TR jet to estimate pulmonary artery systolic pressure. Normal right atrial pressure.   Assessment     ICD-10-CM   1. Palpitations  R00.2   2. Essential hypertension  I10   3. Hypovitaminosis D  E55.9   4. Mixed hyperlipidemia  E78.2   5. Tobacco use disorder  F17.200      EKG 07/26/2018: Normal sinus rhythm at rate of 79 bpm, right atrial enlargement, left axis deviation.  Incomplete right bundle branch block.  Recommendations:   Patient was seen by me about 4 weeks ago to 6 weeks ago for palpitations which I felt were due to PACs and PVCs.  Mostly occurring at rest.  He was also hypertensive.  I started him on diltiazem which he is tolerating.  Blood pressure is still uncontrolled,  I will add lisinopril HCT 20/12.5 mg in the morning.  He has not taken his medication for the past 3 days, compliance with medication discussed, he had run out of prescription and did not picked up a new one. I'll obtain BMP in 2-3 weeks for follow-up.  I'll see him back in 6 weeks.  I also spoke about smoking cessation.  He is now quit cigarettes and is using cigarillos 2-3 a day which she is willing to quit.  He is also changed his alcohol drinking to drinking light.  I also discussed regarding excessive alcohol use and the effects of alcohol on mixed hyperlipidemia as well as hypertension.  Regard to hyperlipidemia, after he does lifestyle changes, we could repeat his lipids.  I have discussed with him regarding low fat diet.  Yates DecampJay Ferron Ishmael, MD, [facility] 09/09/2018, 2:36 PM [facility] Pager: [phone] Office: [phone] If no answer Cell [phone]

## 2018-09-23 ENCOUNTER — Other Ambulatory Visit: Payer: Self-pay

## 2018-09-23 ENCOUNTER — Ambulatory Visit (HOSPITAL_COMMUNITY)
Admission: EM | Admit: 2018-09-23 | Discharge: 2018-09-23 | Disposition: A | Discharge status: Home / Self Care | Payer: 59 | Attending: Family Medicine | Admitting: Family Medicine

## 2018-09-23 ENCOUNTER — Encounter (HOSPITAL_COMMUNITY): Payer: Self-pay | Admitting: Emergency Medicine

## 2018-09-23 DIAGNOSIS — L304 Erythema intertrigo: Secondary | ICD-10-CM

## 2018-09-23 HISTORY — DX: Essential (primary) hypertension: I10

## 2018-09-23 MED ORDER — CLOTRIMAZOLE-BETAMETHASONE 1-0.05 % EX CREA: TOPICAL_CREAM | CUTANEOUS | 0 refills | Status: DC

## 2018-09-23 MED ORDER — NYSTATIN 100000 UNIT/GM EX POWD: Freq: Two times a day (BID) | CUTANEOUS | 0 refills | Status: DC

## 2018-09-23 NOTE — ED Triage Notes (Signed)
Pt sts rash that he thinks is from jock itch

## 2018-09-23 NOTE — Discharge Instructions (Signed)
Use the cream twice a day for about 2 weeks.  After the rash is cleared up apply the powder twice a day after washing and thoroughly drying the area.  The powder will help prevent the rash from coming back

## 2018-09-23 NOTE — ED Provider Notes (Signed)
[facility]    CSN: [id number] Arrival date & time: 09/23/18  1859      History   Chief Complaint Chief Complaint  Patient presents with  . Rash    HPI Wesley Best is a 43 y.o. male.   HPI  Patient is here for follow-up of a chronic rash in his groin.  He states that the cream he was given when [facility] worked beautifully for him.  He would like a refill of this.  Review of the chart indicates that it was Lotrisone.  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  Is very hot and sweaty.  He has difficulty preventing recurrence of this rash.  Discussed trying to keep the area dry.  Discussed using creams to clear up the rash and then powders to try to prevent.  Past Medical History:  Diagnosis Date  . Abscess   . Hypertension     There are no active problems to display for this patient.   Past Surgical History:  Procedure Laterality Date  . FOOT SURGERY    . HERNIA REPAIR    . lymph node removal         Home Medications    Prior to Admission medications   Medication Sig Start Date End Date Taking? Authorizing Provider  clotrimazole-betamethasone (LOTRISONE) cream Apply to affected area 2 times daily prn 09/23/18   Eustace MooreNelson, Gianpaolo Mindel Sue, MD  diltiazem [facility](CARDIZEM CD) 240 MG 24 hr capsule Take 1 capsule (240 mg total) by mouth every evening. 09/09/18 06/06/19  Yates DecampGanji, Jay, MD  lisinopril-hydrochlorothiazide (ZESTORETIC) 20-12.5 MG tablet Take 1 tablet by mouth every morning. 09/09/18   Yates DecampGanji, Jay, MD  nystatin (MYCOSTATIN/NYSTOP) powder Apply topically 2 (two) times daily. 09/23/18   Eustace MooreNelson, Mykaela Arena Sue, MD    Family History Family History  Problem Relation Age of Onset  . Hypertension Mother   . Diabetes Mother   . Stroke Father   . Hypertension Father   . Heart failure Neg Hx     Social History Social History   Tobacco Use  . Smoking status: Current Every Day Smoker    Packs/day: 13.00    Types: Cigarettes    Start date:  07/26/1998  . Smokeless tobacco: Never Used  Substance Use Topics  . Alcohol use: Yes    Comment: 12 per week  . Drug use: No     Allergies   Penicillins and Shellfish allergy   Review of Systems Review of Systems  Constitutional: Negative for chills and fever.  HENT: Negative for ear pain and sore throat.   Eyes: Negative for pain and visual disturbance.  Respiratory: Negative for cough and shortness of breath.   Cardiovascular: Negative for chest pain and palpitations.  Gastrointestinal: Negative for abdominal pain and vomiting.  Genitourinary: Negative for dysuria and hematuria.  Musculoskeletal: Negative for arthralgias and back pain.  Skin: Positive for rash. Negative for color change.  Neurological: Negative for seizures and syncope.  All other systems reviewed and are negative.    Physical Exam Triage Vital Signs ED Triage Vitals [09/23/18 1916]  Enc Vitals Group     BP 137/82     Pulse Rate 68     Resp 18     Temp 98.3 F (36.8 C)     Temp Source Oral     SpO2 98 %     Weight      Height  Head Circumference      Peak Flow      Pain Score 0     Pain Loc      Pain Edu?      Excl. in [place]?    No data found.  Updated Vital Signs BP 137/82 (BP Location: Right Arm)   Pulse 68   Temp 98.3 F (36.8 C) (Oral)   Resp 18   SpO2 98%      Physical Exam Constitutional:      General: He is not in acute distress.    Appearance: He is well-developed.  HENT:     Head: Normocephalic and atraumatic.  Eyes:     Conjunctiva/sclera: Conjunctivae normal.     Pupils: Pupils are equal, round, and reactive to light.  Neck:     Musculoskeletal: Normal range of motion.  Cardiovascular:     Rate and Rhythm: Normal rate.  Pulmonary:     Effort: Pulmonary effort is normal. No respiratory distress.  Abdominal:     General: There is no distension.     Palpations: Abdomen is soft.  Musculoskeletal: Normal range of motion.  Skin:    General: Skin is warm and dry.      Comments: Skin on both sides of the scrotum adjacent to the thighs is thickened and hypertrophic and hyperpigmented.  There is no scale.  No satellite lesions.  Neurological:     Mental Status: He is alert.  Psychiatric:        Mood and Affect: Mood normal.        Behavior: Behavior normal.      UC Treatments / Results  Labs (all labs ordered are listed, but only abnormal results are displayed) Labs Reviewed - No data to display  EKG   Radiology No results found.  Procedures Procedures (including critical care time)  Medications Ordered in UC Medications - No data to display  Initial Impression / Assessment and Plan / UC Course  I have reviewed the triage vital signs and the nursing notes.  Pertinent labs & imaging results that were available during my care of the patient were reviewed by me and considered in my medical decision making (see chart for details).     Since patient did get improvement with the Lotrisone will refill this.  This may be more of an eczematous reaction than actual fungal. Final Clinical Impressions(s) / UC Diagnoses   Final diagnoses:  Pruritic intertrigo     Discharge Instructions     Use the cream twice a day for about 2 weeks.  After the rash is cleared up apply the powder twice a day after washing and thoroughly drying the area.  The powder will help prevent the rash from coming back   ED Prescriptions    Medication Sig Dispense Auth. Provider   clotrimazole-betamethasone (LOTRISONE) cream Apply to affected area 2 times daily prn 45 g Raylene Everts, MD   nystatin (MYCOSTATIN/NYSTOP) powder Apply topically 2 (two) times daily. 30 g Raylene Everts, MD     Controlled Substance Prescriptions [place] Controlled Substance Registry consulted? Not Applicable   Raylene Everts, MD 09/23/18 Karl Bales

## 2018-10-21 ENCOUNTER — Ambulatory Visit (INDEPENDENT_AMBULATORY_CARE_PROVIDER_SITE_OTHER): Payer: 59 | Admitting: Podiatry

## 2018-10-21 ENCOUNTER — Other Ambulatory Visit: Payer: Self-pay

## 2018-10-21 ENCOUNTER — Other Ambulatory Visit: Payer: Self-pay | Admitting: Podiatry

## 2018-10-21 ENCOUNTER — Ambulatory Visit (INDEPENDENT_AMBULATORY_CARE_PROVIDER_SITE_OTHER): Payer: 59

## 2018-10-21 ENCOUNTER — Encounter: Payer: Self-pay | Admitting: Podiatry

## 2018-10-21 DIAGNOSIS — L84 Corns and callosities: Secondary | ICD-10-CM | POA: Diagnosis not present

## 2018-10-21 DIAGNOSIS — M2042 Other hammer toe(s) (acquired), left foot: Secondary | ICD-10-CM

## 2018-10-21 DIAGNOSIS — M2041 Other hammer toe(s) (acquired), right foot: Secondary | ICD-10-CM

## 2018-10-21 DIAGNOSIS — M79672 Pain in left foot: Secondary | ICD-10-CM | POA: Diagnosis not present

## 2018-10-21 DIAGNOSIS — M79671 Pain in right foot: Secondary | ICD-10-CM | POA: Diagnosis not present

## 2018-10-21 DIAGNOSIS — M722 Plantar fascial fibromatosis: Secondary | ICD-10-CM | POA: Diagnosis not present

## 2018-10-21 DIAGNOSIS — M21619 Bunion of unspecified foot: Secondary | ICD-10-CM | POA: Diagnosis not present

## 2018-10-27 NOTE — Progress Notes (Signed)
Subjective:   Patient ID: Wesley Best, male   DOB: 43 y.o.   MRN: [id number]   HPI Patient presents with quite a bit of discomfort in the plantar aspect of both feet and also was noted to have a lesion formation right that is tender and makes it hard to wear shoe gear or walk comfortably.  Patient does not smoke currently and would like to be more active.  Patient had previous surgery by me a number of years on the left foot which improved his condition but has severe structural problems bilateral   Review of Systems  All other systems reviewed and are negative.       Objective:  Physical Exam Vitals signs and nursing note reviewed.  Constitutional:      Appearance: He is well-developed.  Pulmonary:     Effort: Pulmonary effort is normal.  Musculoskeletal: Normal range of motion.  Skin:    General: Skin is warm.  Neurological:     Mental Status: He is alert.     Neurovascular status was found to be intact muscle strength is adequate with patient noted to have severe lesion subsecond fifth metatarsal right underneath the fifth metatarsal base bilateral and hallux right over left with previous surgery left which made a huge difference as far as several lesions ago.  He does have a significant family history of this and was found to have inflammation fluid around the head of the fifth MPJ right.  Also noted to have significant bunion deformity hammertoe deformity which is contributory to all the problems he develops     Assessment:  Patient has significant structural malalignment bilateral with pressure points and history of severe keratotic tissue formation secondary to the pressure he experiences with patient having weightbearing job     Plan:  H&P conditions reviewed discussed at great length.  I did inject the fifth MPJ right 3 mg Dexasone Kenalog 5 mg Xylocaine I debrided all lesions to try to take pressure off of the underlying tissue and discussed the possibility for elevating  osteotomies or possible digital correction in future but difficult for patient to do due to work schedule currently.  I will see him back in 6 weeks also discussed the possibility for orthotics to try to reduce plantar pressure on his feet  X-rays indicate he is got malalignment screws in the second and fifth metatarsal left secondary to previous surgery signed visit

## 2018-10-28 ENCOUNTER — Ambulatory Visit: Payer: 59 | Admitting: Cardiology

## 2018-11-21 ENCOUNTER — Ambulatory Visit: Payer: 59 | Admitting: Cardiology

## 2018-12-02 ENCOUNTER — Ambulatory Visit: Payer: 59 | Admitting: Podiatry

## 2018-12-05 ENCOUNTER — Ambulatory Visit: Payer: 59 | Admitting: Podiatry

## 2018-12-07 ENCOUNTER — Other Ambulatory Visit: Payer: Self-pay

## 2018-12-07 ENCOUNTER — Ambulatory Visit (INDEPENDENT_AMBULATORY_CARE_PROVIDER_SITE_OTHER): Payer: 59 | Admitting: Podiatry

## 2018-12-07 ENCOUNTER — Encounter: Payer: Self-pay | Admitting: Podiatry

## 2018-12-07 DIAGNOSIS — L84 Corns and callosities: Secondary | ICD-10-CM | POA: Diagnosis not present

## 2018-12-07 DIAGNOSIS — M7752 Other enthesopathy of left foot: Secondary | ICD-10-CM

## 2018-12-07 DIAGNOSIS — M779 Enthesopathy, unspecified: Secondary | ICD-10-CM

## 2018-12-07 NOTE — Progress Notes (Signed)
Subjective:   Patient ID: Wesley Best, male   DOB: 43 y.o.   MRN: [id number]   HPI Patient presents stating these lesions are really sore in the side of my left foot hurts and I know eventually I will get a need surgery   ROS      Objective:  Physical Exam  Neurovascular status intact with inflammation pain of the fifth metatarsal base with fluid buildup around the tendon insertion with severe keratotic lesion bilateral     Assessment:  Peroneal tendinitis with inflammation left along with severe keratotic lesion bilateral     Plan:  Sterile prep injected the base fifth metatarsal left at the insertion peroneal 3 mg dexamethasone Kenalog pyelogram Xylocaine debrided lesions bilateral and reappoint when symptomatic and eventually surgical intervention will probably be necessary

## 2018-12-15 ENCOUNTER — Other Ambulatory Visit: Payer: Self-pay

## 2018-12-15 ENCOUNTER — Encounter (HOSPITAL_COMMUNITY): Payer: Self-pay | Admitting: Emergency Medicine

## 2018-12-15 ENCOUNTER — Ambulatory Visit (HOSPITAL_COMMUNITY)
Admission: EM | Admit: 2018-12-15 | Discharge: 2018-12-15 | Disposition: A | Discharge status: Home / Self Care | Payer: 59 | Attending: Urgent Care | Admitting: Urgent Care

## 2018-12-15 DIAGNOSIS — N4889 Other specified disorders of penis: Secondary | ICD-10-CM

## 2018-12-15 DIAGNOSIS — Z7251 High risk heterosexual behavior: Secondary | ICD-10-CM | POA: Diagnosis present

## 2018-12-15 DIAGNOSIS — M87052 Idiopathic aseptic necrosis of left femur: Secondary | ICD-10-CM | POA: Diagnosis present

## 2018-12-15 DIAGNOSIS — N50812 Left testicular pain: Secondary | ICD-10-CM | POA: Diagnosis present

## 2018-12-15 DIAGNOSIS — M25552 Pain in left hip: Secondary | ICD-10-CM | POA: Diagnosis present

## 2018-12-15 MED ORDER — AZITHROMYCIN 250 MG PO TABS
ORAL_TABLET | ORAL | Status: AC
  Filled 2018-12-15: qty 4

## 2018-12-15 MED ORDER — AZITHROMYCIN 250 MG PO TABS
1000.0000 mg | ORAL_TABLET | Freq: Once | ORAL | Status: AC
  Administered 2018-12-15: 1000 mg via ORAL

## 2018-12-15 MED ORDER — CEFTRIAXONE SODIUM 250 MG IJ SOLR
250.0000 mg | Freq: Once | INTRAMUSCULAR | Status: AC
  Administered 2018-12-15: 250 mg via INTRAMUSCULAR

## 2018-12-15 MED ORDER — CEFTRIAXONE SODIUM 250 MG IJ SOLR
INTRAMUSCULAR | Status: AC
  Filled 2018-12-15: qty 250

## 2018-12-15 NOTE — ED Provider Notes (Signed)
MRN: [id number] DOB: 1975-06-14  Subjective:   Wesley Best is a 43 y.o. male presenting for recurrent left-sided groin pain.  Patient has a history of avascular necrosis, diagnosed in 2009.  Symptoms are not as severe as they were in the past.  Patient is able to walk but is uncomfortable from his left groin pain.  He states that he now has pain that is in his left testicle and also in his penis.  Denies history of renal stone.  Patient is sexually active, does not use condoms for protection.  No current facility-administered medications for this encounter.   Current Outpatient Medications:  .  clotrimazole-betamethasone (LOTRISONE) cream, Apply to affected area 2 times daily prn, Disp: 45 g, Rfl: 0 .  diltiazem (CARDIZEM CD) 240 MG 24 hr capsule, Take 1 capsule (240 mg total) by mouth every evening., Disp: 90 capsule, Rfl: 2 .  lisinopril-hydrochlorothiazide (ZESTORETIC) 20-12.5 MG tablet, Take 1 tablet by mouth every morning., Disp: 30 tablet, Rfl: 2 .  nystatin (MYCOSTATIN/NYSTOP) powder, Apply topically 2 (two) times daily., Disp: 30 g, Rfl: 0   Allergies  Allergen Reactions  . Penicillins Hives  . Shellfish Allergy Swelling    Past Medical History:  Diagnosis Date  . Abscess   . Hypertension      Past Surgical History:  Procedure Laterality Date  . FOOT SURGERY    . HERNIA REPAIR    . lymph node removal      ROS  Objective:   Vitals: BP (!) 148/104 (BP Location: Left Arm)   Pulse 78   Temp 97.8 F (36.6 C) (Temporal)   Resp 16   SpO2 100%   Physical Exam Constitutional:      Appearance: Normal appearance. He is well-developed and normal weight.  HENT:     Head: Normocephalic and atraumatic.     Right Ear: External ear normal.     Left Ear: External ear normal.     Nose: Nose normal.     Mouth/Throat:     Pharynx: Oropharynx is clear.  Eyes:     Extraocular Movements: Extraocular movements intact.     Pupils: Pupils are equal, round, and reactive to  light.  Cardiovascular:     Rate and Rhythm: Normal rate.  Pulmonary:     Effort: Pulmonary effort is normal.  Genitourinary:    Penis: Normal and circumcised. No phimosis, paraphimosis, hypospadias, erythema, tenderness, discharge, swelling or lesions.   Musculoskeletal:     Left hip: He exhibits decreased range of motion (Full flexion), decreased strength (Secondary to pain) and tenderness (Over area outlined). He exhibits no bony tenderness, no swelling, no crepitus, no deformity and no laceration.       Legs:  Neurological:     Mental Status: He is alert and oriented to person, place, and time.  Psychiatric:        Mood and Affect: Mood normal.        Behavior: Behavior normal.      Assessment and Plan :   1. Penile pain   2. Testicular pain, left   3. Unprotected sex   4. Left hip pain   5. Avascular necrosis of femoral head, left (HCC)     Counseled patient on avascular necrosis and need for follow-up with Ortho.  He refused repeat imaging today.  We will treat empirically per CDC guidelines for gonorrhea and chlamydia with IM ceftriaxone and azithromycin in clinic.  Counseled on safe sex practices including abstaining for 1 week following  treatment.  Counseled patient on potential for adverse effects with medications prescribed/recommended today, ER and return-to-clinic precautions discussed, patient verbalized understanding.   Jaynee Eagles, [place] 12/15/18 1944

## 2018-12-15 NOTE — ED Triage Notes (Signed)
PT has some left sided groin pain that extends into left testicle. PT reported some pain today in the tip of his penis. This pain was exacerbated by walking. No discharge. No dysuria.

## 2018-12-19 LAB — CYTOLOGY, (ORAL, ANAL, URETHRAL) ANCILLARY ONLY
Chlamydia: NEGATIVE
Neisseria Gonorrhea: NEGATIVE
Trichomonas: NEGATIVE

## 2019-10-02 ENCOUNTER — Other Ambulatory Visit: Payer: Self-pay

## 2019-10-02 ENCOUNTER — Ambulatory Visit (HOSPITAL_COMMUNITY)
Admission: EM | Admit: 2019-10-02 | Discharge: 2019-10-02 | Disposition: A | Discharge status: Home / Self Care | Payer: 59 | Attending: Family Medicine | Admitting: Family Medicine

## 2019-10-02 DIAGNOSIS — K047 Periapical abscess without sinus: Secondary | ICD-10-CM | POA: Diagnosis not present

## 2019-10-02 DIAGNOSIS — Z760 Encounter for issue of repeat prescription: Secondary | ICD-10-CM

## 2019-10-02 DIAGNOSIS — I10 Essential (primary) hypertension: Secondary | ICD-10-CM

## 2019-10-02 DIAGNOSIS — K029 Dental caries, unspecified: Secondary | ICD-10-CM

## 2019-10-02 MED ORDER — HYDROCODONE-ACETAMINOPHEN 7.5-325 MG PO TABS: 1.0000 | ORAL_TABLET | Freq: Four times a day (QID) | ORAL | 0 refills | Status: DC | PRN

## 2019-10-02 MED ORDER — DILTIAZEM HCL ER COATED BEADS 240 MG PO CP24: 240.0000 mg | ORAL_CAPSULE | Freq: Every evening | ORAL | 0 refills | Status: DC

## 2019-10-02 MED ORDER — LISINOPRIL-HYDROCHLOROTHIAZIDE 20-12.5 MG PO TABS: 1.0000 | ORAL_TABLET | Freq: Every morning | ORAL | 0 refills | Status: DC

## 2019-10-02 MED ORDER — IBUPROFEN 800 MG PO TABS
ORAL_TABLET | ORAL | Status: AC
  Filled 2019-10-02: qty 1

## 2019-10-02 MED ORDER — CLINDAMYCIN HCL 300 MG PO CAPS: 300.0000 mg | ORAL_CAPSULE | Freq: Three times a day (TID) | ORAL | 0 refills | Status: DC

## 2019-10-02 MED ORDER — CLOTRIMAZOLE-BETAMETHASONE 1-0.05 % EX CREA: TOPICAL_CREAM | CUTANEOUS | 0 refills | Status: DC

## 2019-10-02 MED ORDER — IBUPROFEN 800 MG PO TABS
800.0000 mg | ORAL_TABLET | Freq: Once | ORAL | Status: AC
  Administered 2019-10-02: 800 mg via ORAL

## 2019-10-02 NOTE — ED Triage Notes (Addendum)
Right lower dental pain x 1 week, tylenol and ibuprofen only providing minimal relief  Pt out of  meds and is requesting refills.

## 2019-10-02 NOTE — Discharge Instructions (Signed)
Call dentist tomorrow Start antibiotic 2 times a day Take ibuprofen for moderate pain Take hydrocodone for severe pain Do not drive on hydrocodone Call your dentist tomorrow for follow-up appointment Your blood pressure medicines are refilled [facility]

## 2019-10-02 NOTE — ED Provider Notes (Signed)
[facility]    CSN: [id number] Arrival date & time: 10/02/19  1812      History   Chief Complaint Chief Complaint  Patient presents with  . Dental Pain  . Medication Refill    HPI Wesley Best is a 44 y.o. male.   HPI  Patient has some dental fractures.  He has had dental pain that is increasing over the last 3 days.  Right lower jaw.  Swelling on the outside of his face.  Pain with chewing. He does not have a primary care doctor.  He is requesting refill on his blood pressure medications. The importance of establishing for primary care for regular medical visits, preventative medicine, and blood work is reviewed with him.  Past Medical History:  Diagnosis Date  . Abscess   . Hypertension     There are no problems to display for this patient.   Past Surgical History:  Procedure Laterality Date  . FOOT SURGERY    . HERNIA REPAIR    . lymph node removal         Home Medications    Prior to Admission medications   Medication Sig Start Date End Date Taking? Authorizing Provider  clindamycin (CLEOCIN) 300 MG capsule Take 1 capsule (300 mg total) by mouth 3 (three) times daily. 10/02/19   Eustace Moore, MD  clotrimazole-betamethasone (LOTRISONE) cream Apply to affected area 2 times daily prn 10/02/19   Eustace Moore, MD  diltiazem (CARDIZEM CD) 240 MG 24 hr capsule Take 1 capsule (240 mg total) by mouth every evening. 10/02/19   Eustace Moore, MD  HYDROcodone-acetaminophen (NORCO) 7.5-325 MG tablet Take 1 tablet by mouth every 6 (six) hours as needed for moderate pain. 10/02/19   Eustace Moore, MD  lisinopril-hydrochlorothiazide (ZESTORETIC) 20-12.5 MG tablet Take 1 tablet by mouth every morning. 10/02/19   Eustace Moore, MD  nystatin (MYCOSTATIN/NYSTOP) powder Apply topically 2 (two) times daily. 09/23/18   Eustace Moore, MD    Family History Family History  Problem Relation Age of Onset  . Hypertension Mother   . Diabetes  Mother   . Stroke Father   . Hypertension Father   . Heart failure Neg Hx     Social History Social History   Tobacco Use  . Smoking status: Current Every Day Smoker    Packs/day: 13.00    Types: Cigarettes    Start date: 07/26/1998  . Smokeless tobacco: Never Used  Substance Use Topics  . Alcohol use: Yes    Comment: 12 per week  . Drug use: No     Allergies   Penicillins and Shellfish allergy   Review of Systems Review of Systems See HPI  Physical Exam Triage Vital Signs ED Triage Vitals  Enc Vitals Group     BP 10/02/19 1912 (!) 198/129     Pulse Rate 10/02/19 1912 92     Resp 10/02/19 1912 16     Temp 10/02/19 1912 98 F (36.7 C)     Temp src --      SpO2 10/02/19 1912 100 %     Weight --      Height --      Head Circumference --      Peak Flow --      Pain Score 10/02/19 1911 10     Pain Loc --      Pain Edu? --      Excl. in GC? --    No  data found.  Updated Vital Signs BP (!) 198/129   Pulse 92   Temp 98 F (36.7 C)   Resp 16   SpO2 100%     Physical Exam Constitutional:      General: He is not in acute distress.    Appearance: He is well-developed.     Comments: Appears uncomfortable  HENT:     Head: Normocephalic and atraumatic.     Mouth/Throat:   Eyes:     Conjunctiva/sclera: Conjunctivae normal.     Pupils: Pupils are equal, round, and reactive to light.  Cardiovascular:     Rate and Rhythm: Normal rate and regular rhythm.     Heart sounds: Normal heart sounds.  Pulmonary:     Effort: Pulmonary effort is normal. No respiratory distress.     Breath sounds: Normal breath sounds.  Abdominal:     General: There is no distension.     Palpations: Abdomen is soft.  Musculoskeletal:        General: Normal range of motion.     Cervical back: Normal range of motion.  Lymphadenopathy:     Cervical: Cervical adenopathy present.  Skin:    General: Skin is warm and dry.  Neurological:     Mental Status: He is alert.  Psychiatric:         Behavior: Behavior normal.      UC Treatments / Results  Labs (all labs ordered are listed, but only abnormal results are displayed) Labs Reviewed - No data to display  EKG   Radiology No results found.  Procedures Procedures (including critical care time)  Medications Ordered in UC Medications  ibuprofen (ADVIL) tablet 800 mg (800 mg Oral Given 10/02/19 2009)    Initial Impression / Assessment and Plan / UC Course  I have reviewed the triage vital signs and the nursing notes.  Pertinent labs & imaging results that were available during my care of the patient were reviewed by me and considered in my medical decision making (see chart for details).       Final Clinical Impressions(s) / UC Diagnoses   Final diagnoses:  Dental infection  Pain due to dental caries  Essential hypertension  Encounter for medication refill     Discharge Instructions     Call dentist tomorrow Start antibiotic 2 times a day Take ibuprofen for moderate pain Take hydrocodone for severe pain Do not drive on hydrocodone Call your dentist tomorrow for follow-up appointment Your blood pressure medicines are refilled Follow-up with [place]   ED Prescriptions    Medication Sig Dispense Auth. Provider   diltiazem (CARDIZEM CD) 240 MG 24 hr capsule Take 1 capsule (240 mg total) by mouth every evening. 90 capsule Eustace Moore, MD   lisinopril-hydrochlorothiazide (ZESTORETIC) 20-12.5 MG tablet Take 1 tablet by mouth every morning. 90 tablet Eustace Moore, MD   clindamycin (CLEOCIN) 300 MG capsule Take 1 capsule (300 mg total) by mouth 3 (three) times daily. 30 capsule Eustace Moore, MD   HYDROcodone-acetaminophen [facility]) 7.5-325 MG tablet Take 1 tablet by mouth every 6 (six) hours as needed for moderate pain. 15 tablet Eustace Moore, MD   clotrimazole-betamethasone (LOTRISONE) cream Apply to affected area 2 times daily prn 45 g Eustace Moore, MD     I have reviewed the PDMP during this encounter.   Eustace Moore, MD 10/02/19 2123

## 2020-01-13 ENCOUNTER — Ambulatory Visit (HOSPITAL_COMMUNITY)
Admission: EM | Admit: 2020-01-13 | Discharge: 2020-01-13 | Disposition: A | Discharge status: Home / Self Care | Payer: 59 | Attending: Emergency Medicine | Admitting: Emergency Medicine

## 2020-01-13 ENCOUNTER — Encounter (HOSPITAL_COMMUNITY): Payer: Self-pay

## 2020-01-13 ENCOUNTER — Other Ambulatory Visit: Payer: Self-pay

## 2020-01-13 DIAGNOSIS — Z1152 Encounter for screening for COVID-19: Secondary | ICD-10-CM | POA: Diagnosis not present

## 2020-01-13 DIAGNOSIS — J209 Acute bronchitis, unspecified: Secondary | ICD-10-CM | POA: Diagnosis present

## 2020-01-13 MED ORDER — PREDNISONE 20 MG PO TABS: 40.0000 mg | ORAL_TABLET | Freq: Every day | ORAL | 0 refills | Status: AC

## 2020-01-13 MED ORDER — DOXYCYCLINE HYCLATE 100 MG PO CAPS: 100.0000 mg | ORAL_CAPSULE | Freq: Two times a day (BID) | ORAL | 0 refills | Status: AC

## 2020-01-13 MED ORDER — ALBUTEROL SULFATE HFA 108 (90 BASE) MCG/ACT IN AERS: 1.0000 | INHALATION_SPRAY | Freq: Four times a day (QID) | RESPIRATORY_TRACT | 0 refills | Status: DC | PRN

## 2020-01-13 MED ORDER — DM-GUAIFENESIN ER 30-600 MG PO TB12: 1.0000 | ORAL_TABLET | Freq: Two times a day (BID) | ORAL | 0 refills | Status: DC

## 2020-01-13 MED ORDER — BENZONATATE 200 MG PO CAPS: 200.0000 mg | ORAL_CAPSULE | Freq: Three times a day (TID) | ORAL | 0 refills | Status: AC | PRN

## 2020-01-13 NOTE — ED Provider Notes (Signed)
[facility]    CSN: [id number] Arrival date & time: 01/13/20  1402      History   Chief Complaint Chief Complaint  Patient presents with  . Covid Exposure    HPI Wesley Best is a 44 y.o. male history of hypertension presenting today for evaluation of URI symptoms.  Patient reports over the past 1 to 2 weeks he has had cough congestion and mucus production.  Reports recently family member tested positive for Covid and he has not been tested since onset of symptoms.  Initially was having more chills and body aches along with fatigue, but this has improved.  Denies any current GI symptoms.  Has felt some shortness of breath and wheezing.  Reports smoking Black and milds, denies cigarette use.  Denies history of asthma or other lung problems.  HPI  Past Medical History:  Diagnosis Date  . Abscess   . Hypertension     There are no problems to display for this patient.   Past Surgical History:  Procedure Laterality Date  . FOOT SURGERY    . HERNIA REPAIR    . lymph node removal         Home Medications    Prior to Admission medications   Medication Sig Start Date End Date Taking? Authorizing Provider  albuterol (VENTOLIN HFA) 108 (90 Base) MCG/ACT inhaler Inhale 1-2 puffs into the lungs every 6 (six) hours as needed for wheezing or shortness of breath. 01/13/20   Victorina Kable C, PA-C  benzonatate (TESSALON) 200 MG capsule Take 1 capsule (200 mg total) by mouth 3 (three) times daily as needed for up to 7 days for cough. 01/13/20 01/20/20  Brentley Landfair C, PA-C  dextromethorphan-guaiFENesin (MUCINEX DM) 30-600 MG 12hr tablet Take 1 tablet by mouth 2 (two) times daily. 01/13/20   Minda Faas C, PA-C  diltiazem (CARDIZEM CD) 240 MG 24 hr capsule Take 1 capsule (240 mg total) by mouth every evening. 10/02/19   Eustace Moore, MD  doxycycline (VIBRAMYCIN) 100 MG capsule Take 1 capsule (100 mg total) by mouth 2 (two) times daily for 7 days. 01/13/20 01/20/20   Younes Degeorge C, PA-C  HYDROcodone-acetaminophen (NORCO) 7.5-325 MG tablet Take 1 tablet by mouth every 6 (six) hours as needed for moderate pain. 10/02/19   Eustace Moore, MD  lisinopril-hydrochlorothiazide (ZESTORETIC) 20-12.5 MG tablet Take 1 tablet by mouth every morning. 10/02/19   Eustace Moore, MD  nystatin (MYCOSTATIN/NYSTOP) powder Apply topically 2 (two) times daily. 09/23/18   Eustace Moore, MD  predniSONE (DELTASONE) 20 MG tablet Take 2 tablets (40 mg total) by mouth daily for 5 days. 01/13/20 01/18/20  Umar Patmon, Junius Creamer, PA-C    Family History Family History  Problem Relation Age of Onset  . Hypertension Mother   . Diabetes Mother   . Stroke Father   . Hypertension Father   . Heart failure Neg Hx     Social History Social History   Tobacco Use  . Smoking status: Current Every Day Smoker    Packs/day: 13.00    Types: Cigarettes    Start date: 07/26/1998  . Smokeless tobacco: Never Used  Substance Use Topics  . Alcohol use: Yes    Comment: 12 per week  . Drug use: No     Allergies   Penicillins and Shellfish allergy   Review of Systems Review of Systems  Constitutional: Positive for fatigue. Negative for activity change, appetite change, chills and fever.  HENT: Positive for  congestion. Negative for ear pain, rhinorrhea, sinus pressure, sore throat and trouble swallowing.   Eyes: Negative for discharge and redness.  Respiratory: Positive for cough, shortness of breath and wheezing. Negative for chest tightness.   Cardiovascular: Negative for chest pain.  Gastrointestinal: Negative for abdominal pain, diarrhea, nausea and vomiting.  Musculoskeletal: Negative for myalgias.  Skin: Negative for rash.  Neurological: Negative for dizziness, light-headedness and headaches.     Physical Exam Triage Vital Signs ED Triage Vitals [01/13/20 1550]  Enc Vitals Group     BP (!) 161/100     Pulse Rate 79     Resp 16     Temp 98.3 F (36.8 C)     Temp  Source Oral     SpO2 100 %     Weight      Height      Head Circumference      Peak Flow      Pain Score 0     Pain Loc      Pain Edu?      Excl. in GC?    No data found.  Updated Vital Signs BP (!) 161/100 (BP Location: Right Arm)   Pulse 79   Temp 98.3 F (36.8 C) (Oral)   Resp 16   SpO2 100%   Visual Acuity Right Eye Distance:   Left Eye Distance:   Bilateral Distance:    Right Eye Near:   Left Eye Near:    Bilateral Near:     Physical Exam Vitals and nursing note reviewed.  Constitutional:      Appearance: He is well-developed.     Comments: No acute distress  HENT:     Head: Normocephalic and atraumatic.     Ears:     Comments: Bilateral ears without tenderness to palpation of external auricle, tragus and mastoid, EAC's without erythema or swelling, TM's with good bony landmarks and cone of light. Non erythematous.     Nose: Nose normal.     Mouth/Throat:     Comments: Oral mucosa pink and moist, no tonsillar enlargement or exudate. Posterior pharynx patent and nonerythematous, no uvula deviation or swelling. Normal phonation. Eyes:     Conjunctiva/sclera: Conjunctivae normal.  Cardiovascular:     Rate and Rhythm: Normal rate and regular rhythm.  Pulmonary:     Effort: Pulmonary effort is normal. No respiratory distress.     Comments: Breathing comfortably at rest, bilateral expiratory wheezing  Abdominal:     General: There is no distension.  Musculoskeletal:        General: Normal range of motion.     Cervical back: Neck supple.  Skin:    General: Skin is warm and dry.  Neurological:     Mental Status: He is alert and oriented to person, place, and time.      UC Treatments / Results  Labs (all labs ordered are listed, but only abnormal results are displayed) Labs Reviewed  SARS CORONAVIRUS 2 (TAT 6-24 HRS)    EKG   Radiology No results found.  Procedures Procedures (including critical care time)  Medications Ordered in  UC Medications - No data to display  Initial Impression / Assessment and Plan / UC Course  I have reviewed the triage vital signs and the nursing notes.  Pertinent labs & imaging results that were available during my care of the patient were reviewed by me and considered in my medical decision making (see chart for details).     Covid test  pending, high suspicion of Covid given close contact in household, likely has turned into bronchitis given associated wheezing.  Symptoms almost 2 weeks, covering for atypicals with doxycycline, butyryl inhaler and prednisone.  Tessalon and Mucinex for further congestion and cough relief.  Discussed strict return precautions. Patient verbalized understanding and is agreeable with plan.  Final Clinical Impressions(s) / UC Diagnoses   Final diagnoses:  Encounter for screening for COVID-19  Acute bronchitis, unspecified organism     Discharge Instructions     Covid test pending, monitor my chart for results If positive please quarantine for an additional 5 days and ensure symptoms improving consistently for 3 days Begin doxycycline twice daily for the next week Prednisone 40 mg daily for 5 days Albuterol inhaler for shortness of breath and wheezing Tessalon for cough Mucinex DM twice daily to further help with congestion and cough Rest and fluids Follow-up if not improving or worsening    ED Prescriptions    Medication Sig Dispense Auth. Provider   doxycycline (VIBRAMYCIN) 100 MG capsule Take 1 capsule (100 mg total) by mouth 2 (two) times daily for 7 days. 14 capsule Kylian Loh C, PA-C   albuterol (VENTOLIN HFA) 108 (90 Base) MCG/ACT inhaler Inhale 1-2 puffs into the lungs every 6 (six) hours as needed for wheezing or shortness of breath. 18 g Alexi Dorminey C, PA-C   predniSONE (DELTASONE) 20 MG tablet Take 2 tablets (40 mg total) by mouth daily for 5 days. 10 tablet Jolleen Seman C, PA-C   benzonatate (TESSALON) 200 MG capsule Take  1 capsule (200 mg total) by mouth 3 (three) times daily as needed for up to 7 days for cough. 28 capsule Rodolfo Notaro C, PA-C   dextromethorphan-guaiFENesin (MUCINEX DM) 30-600 MG 12hr tablet Take 1 tablet by mouth 2 (two) times daily. 20 tablet Porcia Morganti, [place] C, PA-C     PDMP not reviewed this encounter.   Lew Dawes, [place] 01/13/20 1619

## 2020-01-13 NOTE — ED Triage Notes (Signed)
Pt present covid exposure from a family member. Pt states he has been coughing, nasal congestion and drainage. Symptoms of the cold started  Two weeks.

## 2020-01-13 NOTE — Discharge Instructions (Signed)
Covid test pending, monitor my chart for results If positive please quarantine for an additional 5 days and ensure symptoms improving consistently for 3 days Begin doxycycline twice daily for the next week Prednisone 40 mg daily for 5 days Albuterol inhaler for shortness of breath and wheezing Tessalon for cough Mucinex DM twice daily to further help with congestion and cough Rest and fluids Follow-up if not improving or worsening

## 2020-01-14 LAB — SARS CORONAVIRUS 2 (TAT 6-24 HRS): SARS Coronavirus 2: NEGATIVE

## 2020-03-17 ENCOUNTER — Emergency Department (HOSPITAL_COMMUNITY)
Admission: EM | Admit: 2020-03-17 | Discharge: 2020-03-17 | Disposition: A | Discharge status: Home / Self Care | Payer: BC Managed Care – PPO | Attending: Emergency Medicine | Admitting: Emergency Medicine

## 2020-03-17 ENCOUNTER — Emergency Department (HOSPITAL_COMMUNITY): Payer: BC Managed Care – PPO

## 2020-03-17 ENCOUNTER — Encounter (HOSPITAL_COMMUNITY): Payer: Self-pay | Admitting: Emergency Medicine

## 2020-03-17 ENCOUNTER — Other Ambulatory Visit: Payer: Self-pay

## 2020-03-17 DIAGNOSIS — I10 Essential (primary) hypertension: Secondary | ICD-10-CM | POA: Diagnosis not present

## 2020-03-17 DIAGNOSIS — R0600 Dyspnea, unspecified: Secondary | ICD-10-CM | POA: Diagnosis not present

## 2020-03-17 DIAGNOSIS — F17210 Nicotine dependence, cigarettes, uncomplicated: Secondary | ICD-10-CM | POA: Insufficient documentation

## 2020-03-17 DIAGNOSIS — R079 Chest pain, unspecified: Secondary | ICD-10-CM | POA: Diagnosis not present

## 2020-03-17 DIAGNOSIS — Z79899 Other long term (current) drug therapy: Secondary | ICD-10-CM | POA: Insufficient documentation

## 2020-03-17 DIAGNOSIS — R002 Palpitations: Secondary | ICD-10-CM

## 2020-03-17 DIAGNOSIS — R0789 Other chest pain: Secondary | ICD-10-CM | POA: Diagnosis not present

## 2020-03-17 DIAGNOSIS — R0602 Shortness of breath: Secondary | ICD-10-CM | POA: Diagnosis not present

## 2020-03-17 LAB — CBC
HCT: 42.1 % (ref 39.0–52.0)
Hemoglobin: 14.6 g/dL (ref 13.0–17.0)
MCH: 33.2 pg (ref 26.0–34.0)
MCHC: 34.7 g/dL (ref 30.0–36.0)
MCV: 95.7 fL (ref 80.0–100.0)
Platelets: 240 10*3/uL (ref 150–400)
RBC: 4.4 MIL/uL (ref 4.22–5.81)
RDW: 13.2 % (ref 11.5–15.5)
WBC: 6.2 10*3/uL (ref 4.0–10.5)
nRBC: 0 % (ref 0.0–0.2)

## 2020-03-17 LAB — TROPONIN I (HIGH SENSITIVITY)
Troponin I (High Sensitivity): 12 ng/L (ref <18)
Troponin I (High Sensitivity): 11 ng/L (ref <18)

## 2020-03-17 LAB — BASIC METABOLIC PANEL
Anion gap: 10 (ref 5–15)
BUN: 14 mg/dL (ref 6–20)
CO2: 24 mmol/L (ref 22–32)
Calcium: 9.2 mg/dL (ref 8.9–10.3)
Chloride: 102 mmol/L (ref 98–111)
Creatinine, Ser: 0.97 mg/dL (ref 0.61–1.24)
GFR, Estimated: >60 mL/min (ref >60)
Glucose, Bld: 119 mg/dL — ABNORMAL HIGH (ref 70–99)
Potassium: 4.3 mmol/L (ref 3.5–5.1)
Sodium: 136 mmol/L (ref 135–145)

## 2020-03-17 LAB — TSH: TSH: 1.344 u[IU]/mL (ref 0.350–4.500)

## 2020-03-17 NOTE — ED Notes (Signed)
An After Visit Summary was printed and given to the patient. Discharge instructions given and no further questions at this time.  

## 2020-03-17 NOTE — ED Triage Notes (Signed)
Patient complains of intermittent heart palpitations since this morning, noticed his heart beating when he work up from sleep. Reports non-compliance w/ lisinopril and Cardizem. Denies chest pain, N/V/D or fevers, endorses some SOB w/ palpations.

## 2020-03-17 NOTE — ED Provider Notes (Signed)
[facility]EMERGENCY DEPT Provider Note   CSN: [id number] Arrival date & time: 03/17/20  1034     History Chief Complaint  Patient presents with  . Palpitations    Wesley Best is a 45 y.o. male with a history of tobacco abuse and hypertension who presents to the emergency department with complaints of palpitations that started last night.  Patient describes the palpitations as a sensation that his heart is racing with associated mild dyspnea and a very mild discomfort in his chest.  He woke up this morning and these seemed worse.  Symptoms are intermittent, lasts variable periods of time, no specific alleviating or aggravating factors.  No intervention prior to arrival.  He states that he has not had any significant lifestyle changes recently, he is sleeping normally, no increase in caffeine intake, no new medications.  He is prescribed lisinopril and Cardizem which he does not take consistently.  Denies fever, chills, cough, dizziness, syncope, hemoptysis, nausea, vomiting, abdominal pain, leg pain/swelling, recent travel/trauma/surgery, or prior VTE/cancer.  HPI     Past Medical History:  Diagnosis Date  . Abscess   . Hypertension     There are no problems to display for this patient.   Past Surgical History:  Procedure Laterality Date  . FOOT SURGERY    . HERNIA REPAIR    . lymph node removal         Family History  Problem Relation Age of Onset  . Hypertension Mother   . Diabetes Mother   . Stroke Father   . Hypertension Father   . Heart failure Neg Hx     Social History   Tobacco Use  . Smoking status: Current Every Day Smoker    Packs/day: 13.00    Types: Cigarettes    Start date: 07/26/1998  . Smokeless tobacco: Never Used  Substance Use Topics  . Alcohol use: Yes    Comment: 12 per week  . Drug use: No    Home Medications Prior to Admission medications   Medication Sig Start Date End Date Taking? Authorizing Provider   albuterol (VENTOLIN HFA) 108 (90 Base) MCG/ACT inhaler Inhale 1-2 puffs into the lungs every 6 (six) hours as needed for wheezing or shortness of breath. 01/13/20   Wieters, Hallie C, PA-C  dextromethorphan-guaiFENesin (MUCINEX DM) 30-600 MG 12hr tablet Take 1 tablet by mouth 2 (two) times daily. 01/13/20   Wieters, Hallie C, PA-C  diltiazem (CARDIZEM CD) 240 MG 24 hr capsule Take 1 capsule (240 mg total) by mouth every evening. 10/02/19   Eustace Moore, MD  HYDROcodone-acetaminophen (NORCO) 7.5-325 MG tablet Take 1 tablet by mouth every 6 (six) hours as needed for moderate pain. 10/02/19   Eustace Moore, MD  lisinopril-hydrochlorothiazide (ZESTORETIC) 20-12.5 MG tablet Take 1 tablet by mouth every morning. 10/02/19   Eustace Moore, MD  nystatin (MYCOSTATIN/NYSTOP) powder Apply topically 2 (two) times daily. 09/23/18   Eustace Moore, MD    Allergies    Penicillins and Shellfish allergy  Review of Systems   Review of Systems  Constitutional: Negative for chills and fever.  Respiratory: Positive for shortness of breath. Negative for cough.   Cardiovascular: Positive for chest pain and palpitations. Negative for leg swelling.  Gastrointestinal: Negative for abdominal pain, nausea and vomiting.  Genitourinary: Negative for dysuria.  Neurological: Negative for dizziness, seizures, syncope, weakness and light-headedness.  All other systems reviewed and are negative.   Physical Exam Updated Vital Signs BP (!) 168/99 (BP  Location: Right Arm)   Pulse 83   Temp 97.8 F (36.6 C) (Oral)   Resp 17   Ht 6\' 2"  (1.88 m)   Wt 74 kg   SpO2 100%   BMI 20.95 kg/m   Physical Exam Vitals and nursing note reviewed.  Constitutional:      General: He is not in acute distress.    Appearance: He is well-developed. He is not toxic-appearing.  HENT:     Head: Normocephalic and atraumatic.  Eyes:     General:        Right eye: No discharge.        Left eye: No discharge.      Conjunctiva/sclera: Conjunctivae normal.  Cardiovascular:     Rate and Rhythm: Normal rate and regular rhythm.     Heart sounds: No murmur heard.     Comments: 2+ symmetric radial pulses. Pulmonary:     Effort: Pulmonary effort is normal. No respiratory distress.     Breath sounds: Normal breath sounds. No wheezing, rhonchi or rales.  Abdominal:     General: There is no distension.     Palpations: Abdomen is soft.     Tenderness: There is no abdominal tenderness.  Musculoskeletal:     Cervical back: Neck supple.     Right lower leg: No edema.     Left lower leg: No edema.  Skin:    General: Skin is warm and dry.     Findings: No rash.  Neurological:     Mental Status: He is alert.     Comments: Clear speech.   Psychiatric:        Behavior: Behavior normal.     ED Results / Procedures / Treatments   Labs (all labs ordered are listed, but only abnormal results are displayed) Labs Reviewed  CBC  BASIC METABOLIC PANEL  TSH  TROPONIN I (HIGH SENSITIVITY)    EKG EKG Interpretation  Date/Time:  Sunday March 17 2020 11:49:36 EST Ventricular Rate:  64 PR Interval:    QRS Duration: 96 QT Interval:  430 QTC Calculation: 444 R Axis:   127 Text Interpretation: Sinus rhythm Right axis deviation ST elevation suggests acute pericarditis No significant change since last tracing Confirmed by [id number] [phone]) on 03/17/2020 12:07:59 PM   Radiology DG Chest Portable 1 View  Result Date: 03/17/2020 CLINICAL DATA:  Dyspnea. Intermittent cardiac palpitations. Chest pain and some shortness of breath. EXAM: PORTABLE CHEST 1 VIEW COMPARISON:  12/15/2017 FINDINGS: The heart size and mediastinal contours are within normal limits. Both lungs are clear. The visualized skeletal structures are unremarkable. IMPRESSION: No active disease. Electronically Signed   By: 12/17/2017 M.D.   On: 03/17/2020 12:41    Procedures Procedures  3:16 PM Cardiac monitoring reveals 70 bpm- NSR, as  reviewed and interpreted by me. Cardiac monitoring was ordered due to palpitations and to monitor patient for dysrhythmia.  Medications Ordered in ED Medications - No data to display  ED Course  I have reviewed the triage vital signs and the nursing notes.  Pertinent labs & imaging results that were available during my care of the patient were reviewed by me and considered in my medical decision making (see chart for details).    MDM Rules/Calculators/A&P                         Patient presents to the ED with complaints of palpitations.  Nontoxic, vitals WNL with the exception of  elevated BP- doubt HTN emergency.  Exam fairly benign.   Additional history obtained:  Additional history obtained from chart review & nursing note review.   EKG: Sinus rhythm Right axis deviation ST elevation suggests acute pericarditis No significant change since last tracing   Lab Tests:  I Ordered, reviewed, and interpreted labs, which included:  CBC: no anemia BMP: no significant electrolyte derangement.  Troponins: flat TSH: WNL  Imaging Studies ordered:  I ordered imaging studies which included CXR, I independently visualized and interpreted imaging which showed no active disease.   ED Course:  No significant electrolyte derangement, thyroid dysfunction or anemia. Low risk wells- doubt PE. Troponins are flat- EKG without acute ischemia compared to prior- doubt ACS. Cardiac monitor reviewed- no arrhythmias. Will discharge home with cardiology follow up. Discussed taking his BP medications as prescribed. I discussed results, treatment plan, need for follow-up, and return precautions with the patient. Provided opportunity for questions, patient confirmed understanding and is in agreement with plan.   Portions of this note were generated with Scientist, clinical (histocompatibility and immunogenetics). Dictation errors may occur despite best attempts at proofreading.   Final Clinical Impression(s) / ED Diagnoses Final diagnoses:   Palpitations    Rx / DC Orders ED Discharge Orders    None       Cherly Anderson, PA-C 03/17/20 1520    Linwood Dibbles, MD 03/18/20 [phone]

## 2020-03-17 NOTE — Discharge Instructions (Addendum)
You were seen in the ER today for palpitations.  Your chest xray, labs, and EKG were reassuring.   Please take your medications as prescribed.  Please follow up with your primary care provider and or cardiology for possible holter monitoring. Return to the ER for new or worsening symptoms including but not limited to new or worsening heart beat sensation or chest pain, increased work of breathing, coughing up blood, fever, dizziness, passingout, or any other concerns.

## 2020-03-23 ENCOUNTER — Emergency Department (HOSPITAL_COMMUNITY)
Admission: EM | Admit: 2020-03-23 | Discharge: 2020-03-24 | Disposition: A | Discharge status: Home / Self Care | Payer: BC Managed Care – PPO | Attending: Emergency Medicine | Admitting: Emergency Medicine

## 2020-03-23 ENCOUNTER — Encounter (HOSPITAL_COMMUNITY): Payer: Self-pay | Admitting: Emergency Medicine

## 2020-03-23 ENCOUNTER — Other Ambulatory Visit: Payer: Self-pay

## 2020-03-23 DIAGNOSIS — R0602 Shortness of breath: Secondary | ICD-10-CM | POA: Insufficient documentation

## 2020-03-23 DIAGNOSIS — Z79899 Other long term (current) drug therapy: Secondary | ICD-10-CM | POA: Diagnosis not present

## 2020-03-23 DIAGNOSIS — I10 Essential (primary) hypertension: Secondary | ICD-10-CM | POA: Diagnosis not present

## 2020-03-23 DIAGNOSIS — R002 Palpitations: Secondary | ICD-10-CM

## 2020-03-23 DIAGNOSIS — F17210 Nicotine dependence, cigarettes, uncomplicated: Secondary | ICD-10-CM | POA: Diagnosis not present

## 2020-03-23 LAB — CBC
HCT: 42.4 % (ref 39.0–52.0)
Hemoglobin: 15.2 g/dL (ref 13.0–17.0)
MCH: 33.3 pg (ref 26.0–34.0)
MCHC: 35.8 g/dL (ref 30.0–36.0)
MCV: 93 fL (ref 80.0–100.0)
Platelets: 261 10*3/uL (ref 150–400)
RBC: 4.56 MIL/uL (ref 4.22–5.81)
RDW: 12.8 % (ref 11.5–15.5)
WBC: 5.2 10*3/uL (ref 4.0–10.5)
nRBC: 0 % (ref 0.0–0.2)

## 2020-03-23 LAB — BASIC METABOLIC PANEL
Anion gap: 13 (ref 5–15)
BUN: 27 mg/dL — ABNORMAL HIGH (ref 6–20)
CO2: 20 mmol/L — ABNORMAL LOW (ref 22–32)
Calcium: 9.8 mg/dL (ref 8.9–10.3)
Chloride: 103 mmol/L (ref 98–111)
Creatinine, Ser: 1.05 mg/dL (ref 0.61–1.24)
GFR, Estimated: >60 mL/min (ref >60)
Glucose, Bld: 113 mg/dL — ABNORMAL HIGH (ref 70–99)
Potassium: 3.9 mmol/L (ref 3.5–5.1)
Sodium: 136 mmol/L (ref 135–145)

## 2020-03-23 NOTE — ED Triage Notes (Signed)
Patient reports seen on 1/30 for palpitations. Reports being compliant with medications since that time but palpitations in the morning with fatigue throughout the day. Denies chest pains.

## 2020-03-24 MED ORDER — NYSTATIN 100000 UNIT/GM EX POWD: Freq: Two times a day (BID) | CUTANEOUS | 0 refills | Status: DC

## 2020-03-24 NOTE — ED Provider Notes (Signed)
[facility]-EMERGENCY DEPT Provider Note   CSN: [id number] Arrival date & time: 03/23/20  1928     History Chief Complaint  Patient presents with  . Fatigue    Wesley Best is a 45 y.o. male with a history of hypertension who presents to the emergency department with a chief complaint of palpitations.  The patient reports that he has been having intermittent episodes of palpitations since he awoke on the morning of January 30.  When episodes initially began, he reports that it was constant throughout most of the day.  He was seen in the ER and had an extensive work-up that was reassuring and he was advised to follow-up with cardiology and ensuring that he was taking his home BP meds as prescribed.  Since he was discharged to home, he has been compliant with his home lisinopril HCTZ and Cardizem, but has continued to have episodes of palpitations.  The episodes were lasting varying length of times.  Most commonly, he is having these episodes in the morning.  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  He will intermittently also have some associated shortness of breath with the palpitations, but not every time.  He adamantly denies chest pain or pressure, leg swelling, back pain, orthopnea, dizziness, lightheadedness, nausea, vomiting, diarrhea, diaphoresis, abdominal pain, headache, neck pain, numbness, weakness, seizure-like activity, syncope.  He does note that there was 1 day earlier this week that he did not have any symptoms and it was a day that he was not working.  He does report that when he works long hours that he does not have significant fluid intake.  He also states that he does wake up very thirsty every morning.  He has not noticed any decreased urine output.   He reports that he does hydrate pretty well when he has not at work.   He reports minimal to no caffeine intake.  No new medications or dietary supplements.  He does report that he drinks approximately 40 ounces of beer daily, but has not recently changed his alcohol intake.  He previously smoked 1 pack a day, but now only consumes approximately 2 black and mild cigarillos daily.  He also endorses intermittent marijuana use.  No other illicit or recreational substance use.  No family history of sudden cardiac death.  His father had a stroke, but no family history of cardiovascular disease.  Prior to resuming his home blood pressure medications this week, he has not taken his home medications in many months.  The patient was previously seen by Dr. Jacinto Halim, cardiology, for palpitations several years ago, but has not followed up within the last few years.  He has never had a Holter monitor/Zio patch.  The history is provided by the patient and medical records. No language interpreter was used.       Past Medical History:  Diagnosis Date  . Abscess   . Hypertension     There are no problems to display for this patient.   Past Surgical History:  Procedure Laterality Date  . FOOT SURGERY    . HERNIA REPAIR    . lymph node removal         Family History  Problem Relation Age of Onset  . Hypertension Mother   . Diabetes Mother   .  Stroke Father   . Hypertension Father   . Heart failure Neg Hx     Social History   Tobacco Use  . Smoking status: Current Every Day Smoker    Packs/day: 13.00    Types: Cigarettes    Start date: 07/26/1998  . Smokeless tobacco: Never Used  Substance Use Topics  . Alcohol use: Yes    Comment: 12 per week  . Drug use: No    Home Medications Prior to Admission medications   Medication Sig Start Date End Date Taking? Authorizing Provider  diltiazem (CARDIZEM CD) 240 MG 24 hr capsule Take 1 capsule (240 mg total) by mouth every evening. 10/02/19  Yes  Eustace Moore, MD  lisinopril-hydrochlorothiazide (ZESTORETIC) 20-12.5 MG tablet Take 1 tablet by mouth every morning. 10/02/19  Yes Eustace Moore, MD  nystatin (MYCOSTATIN/NYSTOP) powder Apply topically 2 (two) times daily. 03/24/20   Elize Pinon A, PA-C  albuterol (VENTOLIN HFA) 108 (90 Base) MCG/ACT inhaler Inhale 1-2 puffs into the lungs every 6 (six) hours as needed for wheezing or shortness of breath. Patient not taking: Reported on 03/23/2020 01/13/20 03/24/20  Patterson Hammersmith C, PA-C    Allergies    Penicillins and Shellfish allergy  Review of Systems   Review of Systems  Constitutional: Negative for appetite change and fever.  HENT: Negative for congestion and sore throat.   Respiratory: Positive for shortness of breath. Negative for cough, chest tightness and wheezing.   Cardiovascular: Positive for palpitations. Negative for chest pain and leg swelling.  Gastrointestinal: Negative for abdominal pain, constipation, diarrhea, nausea and vomiting.  Genitourinary: Negative for dysuria.  Musculoskeletal: Negative for back pain, myalgias and neck stiffness.  Skin: Negative for rash and wound.  Allergic/Immunologic: Negative for immunocompromised state.  Neurological: Negative for dizziness, seizures, syncope, weakness, numbness and headaches.  Psychiatric/Behavioral: Negative for confusion.    Physical Exam Updated Vital Signs BP (!) 150/105 (BP Location: Right Arm)   Pulse 99   Temp 98.4 F (36.9 C) (Oral)   Resp 18   Ht 6\' 2"  (1.88 m)   Wt 73.9 kg   SpO2 100%   BMI 20.93 kg/m   Physical Exam Vitals and nursing note reviewed.  Constitutional:      General: He is not in acute distress.    Appearance: He is well-developed. He is not ill-appearing, toxic-appearing or diaphoretic.  HENT:     Head: Normocephalic.     Mouth/Throat:     Mouth: Mucous membranes are moist.  Eyes:     Extraocular Movements: Extraocular movements intact.     Conjunctiva/sclera:  Conjunctivae normal.     Pupils: Pupils are equal, round, and reactive to light.  Cardiovascular:     Rate and Rhythm: Normal rate and regular rhythm.     Pulses: Normal pulses.     Heart sounds: Normal heart sounds. No murmur heard. No friction rub.     Comments: Bounding bilateral radial pulses. Pulmonary:     Effort: Pulmonary effort is normal. No respiratory distress.     Breath sounds: No stridor. No wheezing, rhonchi or rales.  Chest:     Chest wall: No tenderness.  Abdominal:     General: There is no distension.     Palpations: Abdomen is soft. There is no mass.     Tenderness: There is no abdominal tenderness. There is no right CVA tenderness, left CVA tenderness, guarding or rebound.     Hernia: No hernia is present.  Musculoskeletal:  Cervical back: Neck supple.  Skin:    General: Skin is warm and dry.     Capillary Refill: Capillary refill takes less than 2 seconds.  Neurological:     General: No focal deficit present.     Mental Status: He is alert.  Psychiatric:        Behavior: Behavior normal.     ED Results / Procedures / Treatments   Labs (all labs ordered are listed, but only abnormal results are displayed) Labs Reviewed  BASIC METABOLIC PANEL - Abnormal; Notable for the following components:      Result Value   CO2 20 (*)    Glucose, Bld 113 (*)    BUN 27 (*)    All other components within normal limits  CBC    EKG EKG Interpretation  Date/Time:  Saturday March 23 2020 20:13:58 EST Ventricular Rate:  81 PR Interval:    QRS Duration: 82 QT Interval:  351 QTC Calculation: 408 R Axis:   111 Text Interpretation: Sinus rhythm Biatrial enlargement Right axis deviation ST elev, probable normal early repol pattern 12 Lead; Mason-Likar No significant change since last tracing Confirmed by Susy Frizzle [phone]) on 03/23/2020 9:08:20 PM   Radiology No results found.  Procedures Procedures   Medications Ordered in ED Medications - No data  to display  ED Course  I have reviewed the triage vital signs and the nursing notes.  Pertinent labs & imaging results that were available during my care of the patient were reviewed by me and considered in my medical decision making (see chart for details).    MDM Rules/Calculators/A&P                          45 year old male with a history of hypertension who presents the emergency department with a chief complaint of palpitations.  He reports that he has intermittently had associated shortness of breath with palpitations.  Patient was seen in the ER last week for the same.  He has previously seen Dr. Jacinto Halim with cardiology, but has not been seen in several years.  He adamantly denies chest pain or pressure.  Labs and imaging have been reviewed and independently interpreted by me.  EKG with normal sinus rhythm.  No evidence of Wolff-Parkinson-White or atrial fibrillation.  No PVCs or PACs present.  He is having no chest pain.  Troponin not indicated at this time.  I reviewed his chest x-ray from a week ago and it was unremarkable.  Lungs are clear to auscultation bilaterally on my evaluation.  No metabolic derangements. BUN/Cr ratio is >20:1.  Question a component of dehydration as patient does have bounding pulses on my exam.  This would be consistent with the patient's history and may be contributing to his symptoms.  I did offer the patient an IV fluid bolus to see if his symptoms would improve.  However, patient preferred to hydrate orally at home, which is reasonable.  I have advised the patient to call cardiology for follow-up as he may benefit from a Zio patch or Holter monitor if his symptoms do not improve with hydration.  ER return precautions given.  I have a low suspicion for ACS, PE, pericarditis, myocarditis, aortic dissection, tension pneumothorax.  He is hemodynamically stable in no acute distress.  Safer discharge to home with outpatient follow-up as indicated.  Final Clinical  Impression(s) / ED Diagnoses Final diagnoses:  Palpitations    Rx / DC Orders ED Discharge  Orders         Ordered    nystatin (MYCOSTATIN/NYSTOP) powder  2 times daily        03/24/20 0008           Kahlie Deutscher A, PA-C 03/24/20 3545    Dione Booze, MD 03/24/20 [phone]

## 2020-03-24 NOTE — Discharge Instructions (Signed)
Thank you for allowing me to care for you today in the Emergency Department.   Please call and schedule a follow-up appointment with Dr. Jacinto Halim for the shortness of breath. You can also try sending him a message in MyChart to let him know about your ER visit. If you do not have a MyChart account, instructions on creating one are attached along with your discharge paperwork.   I have also included the information for our other cardiology practice. You should be able to ask them about the co-pay cost prior to making an appointment to see if it's the same as Dr. Jacinto Halim or not.  Please make sure that you are drinking at least 64 ounces of fluid a day.  Try to be intentional about drinking fluids while you are at work.  You may increase this even more since you do drink some alcohol.  You can keep a daily diary of your symptoms to include how much water you are drinking, when you are noticing palpitations, how many times you are going to the bathroom every day.   If you have the palpitations again, it might be helpful to know what your heart rate is on your smart watch.   Please make sure that you are taking your home blood pressure medications daily. High blood pressure is a long term risk factor for stroke.   Return the emergency department if you have chest pain accompanied by shortness of breath, if you pass out, if you become very clammy, if you are sweating, with changes in your vision, vomiting, numbness or weakness, or other new, concerning symptoms.

## 2020-03-25 NOTE — Progress Notes (Signed)
Primary Physician/Referring:  Patient, No Pcp Per  Patient ID: Wesley Best, male    DOB: 10-06-1975, 45 y.o.   MRN: [id number]  Chief Complaint  Patient presents with   Shortness of Breath   Follow-up   Hospitalization Follow-up   HPI:    HPI: Wesley Best  is a 45 y.o. male with history of hypertension who was previously evaluated in our office for palpitations.   Patient presents for urgent visit with complaints of shortness of breath and palpitations.  He was last seen in our office by Dr. Jacinto Halim on 09/09/2018 for palpitations and hypertension management.  At that time patient was recommended to follow-up in our office in 6 weeks, unfortunately he was lost to follow-up until now. At last visit in our office patient was experiencing similar symptoms of palpations, however, frequency has now increased and he is now experiencing associated shortness of breath.   Over the last 3 months patient has had frequent visits to the emergency department for evaluation of palpitations.  Most recently patient was evaluated in the emergency department 03/23/2020, work-up was negative for ACS or underlying cardiac arrhythmia.  At that time suspected dehydration to be contributing to patient's symptoms and it was recommended patient hydrate orally at home.Unfortunately patient continue to smoke and drinks 2-3 40 oz beers everyday.   Patient states he made an urgent visit because he has had worsening fatigue and frequent palpitations with associated shortness of breath for the last 2 weeks. Home blood pressure readings have been well controlled since patient restarted lisinopril/HCTZ about 2 weeks ago. He is also taking diltiazem 240 mg in the afternoon each day.   Past Medical History:  Diagnosis Date   Abscess    Hypertension    Past Surgical History:  Procedure Laterality Date   FOOT SURGERY     HERNIA REPAIR     lymph node removal     Social History   Tobacco Use   Smoking status:  Current Every Day Smoker    Packs/day: 13.00    Types: Cigarettes    Start date: 07/26/1998   Smokeless tobacco: Never Used  Substance Use Topics   Alcohol use: Yes    Comment: 12 per week   Marital status: Married  ROS  Review of Systems  Constitutional: Positive for malaise/fatigue. Negative for chills, decreased appetite and weight gain.  Cardiovascular: Positive for palpitations. Negative for chest pain, claudication, dyspnea on exertion, leg swelling, near-syncope, orthopnea, paroxysmal nocturnal dyspnea and syncope.  Respiratory: Positive for shortness of breath.   Endocrine: Negative for cold intolerance.  Hematologic/Lymphatic: Does not bruise/bleed easily.  Musculoskeletal: Negative for joint swelling.  Gastrointestinal: Negative for abdominal pain, anorexia, change in bowel habit, hematochezia and melena.  Neurological: Negative for dizziness, headaches, light-headedness and weakness.  Psychiatric/Behavioral: Negative for depression and substance abuse.  All other systems reviewed and are negative.  Objective  Blood pressure 140/87, pulse 81, temperature 98.2 F (36.8 C), weight 168 lb (76.2 kg), SpO2 99 %. Body mass index is 21.57 kg/m.   Physical Exam Vitals reviewed.  Constitutional:      General: He is not in acute distress.    Appearance: He is well-developed.  HENT:     Head: Normocephalic and atraumatic.  Eyes:     Conjunctiva/sclera: Conjunctivae normal.  Neck:     Thyroid: No thyromegaly.     Vascular: No JVD.  Cardiovascular:     Rate and Rhythm: Normal rate and regular rhythm.  Pulses: Intact distal pulses.     Heart sounds: Normal heart sounds, S1 normal and S2 normal. No murmur heard. No gallop.   Pulmonary:     Effort: Pulmonary effort is normal. No respiratory distress.     Breath sounds: Normal breath sounds. No wheezing, rhonchi or rales.  Abdominal:     General: Bowel sounds are normal.     Palpations: Abdomen is soft.   Musculoskeletal:        General: Normal range of motion.     Cervical back: Neck supple.     Right lower leg: No edema.     Left lower leg: No edema.  Skin:    General: Skin is warm and dry.  Neurological:     Mental Status: He is alert.    Laboratory examination:   CMP Latest Ref Rng & Units 03/23/2020 03/17/2020 08/31/2018  Glucose 70 - 99 mg/dL 509(T) 267(T) 89  BUN 6 - 20 mg/dL 24(P) 14 16  Creatinine 0.61 - 1.24 mg/dL 8.09 9.83 3.82  Sodium 135 - 145 mmol/L 136 136 140  Potassium 3.5 - 5.1 mmol/L 3.9 4.3 4.6  Chloride 98 - 111 mmol/L 103 102 100  CO2 22 - 32 mmol/L 20(L) 24 21  Calcium 8.9 - 10.3 mg/dL 9.8 9.2 9.4  Total Protein 6.0 - 8.5 g/dL - - 7.2  Total Bilirubin 0.0 - 1.2 mg/dL - - 0.2  Alkaline Phos 39 - 117 IU/L - - 58  AST 0 - 40 IU/L - - 31  ALT 0 - 44 IU/L - - 24   CBC Latest Ref Rng & Units 03/23/2020 03/17/2020 08/31/2018  WBC 4.0 - 10.5 K/uL 5.2 6.2 5.6  Hemoglobin 13.0 - 17.0 g/dL 50.5 39.7 67.3  Hematocrit 39.0 - 52.0 % 42.4 42.1 42.9  Platelets 150 - 400 K/uL 261 240 272   Lipid Panel     Component Value Date/Time   CHOL 266 (H) 08/31/2018 1335   TRIG 295 (H) 08/31/2018 1335   HDL 76 08/31/2018 1335   LDLCALC 131 (H) 08/31/2018 1335   HEMOGLOBIN A1C No results found for: HGBA1C, MPG TSH Recent Labs    03/17/20 1130  TSH 1.344   Vitamin D 1, 25 (OH)2 Total 08/31/2018 pg/mL 47   Comment: Reference Range:  Adults: 21 - 65     Medications and Allergies    Current Outpatient Medications  Medication Instructions   diltiazem (CARDIZEM CD) 240 mg, Oral, Nightly   lisinopril-hydrochlorothiazide (ZESTORETIC) 20-12.5 MG tablet 1 tablet, Oral, Every morning   nystatin (MYCOSTATIN/NYSTOP) powder Topical, 2 times daily   Allergies  Allergen Reactions   Penicillins Hives   Shellfish Allergy Swelling   Radiology   No results found.  Cardiac Studies:   Echocardiogram 09/02/2018: Normal LV systolic function with EF 70%. Left ventricle  cavity is normal in size. Mild concentric hypertrophy of the left ventricle. Normal global wall motion. Normal diastolic filling pattern.  No significant valvular abnormalities.  Inadequate TR jet to estimate pulmonary artery systolic pressure. Normal right atrial pressure.    EKG   EKG 02/0/2022: Normal sinus rhythm at rate of 75 bpm. Bi-atrial enlargement. Left axis. Incomplete right bundle branch block. Early repolarization. Compared to EKG 07/26/18, no significance change.   Assessment     ICD-10-CM   1. Palpitations  R00.2 LONG TERM MONITOR (3-14 DAYS)  2. Essential hypertension  I10 diltiazem (CARDIZEM CD) 240 MG 24 hr capsule  3. Mixed hyperlipidemia  E78.2 Lipid Panel With LDL/HDL Ratio  4. Tobacco use disorder  F17.200   5. SOB (shortness of breath)  R06.02 EKG 12-Lead    LONG TERM MONITOR (3-14 DAYS)    Meds ordered this encounter  Medications   diltiazem (CARDIZEM CD) 240 MG 24 hr capsule    Sig: Take 1 capsule (240 mg total) by mouth at bedtime.    Dispense:  90 capsule    Refill:  0   Medications Discontinued During This Encounter  Medication Reason   diltiazem (CARDIZEM CD) 240 MG 24 hr capsule     Recommendations:   Wesley Best  is a 45 y.o. male with history of hypertension who was previously evaluated in our office for palpitations.   Patient presents for urgent visit with complaints of shortness of breath and palpitations.  He was last seen in our office by Dr. Jacinto Halim on 09/09/2018 for palpitations and hypertension management.   Patient presents for urgent visit with concerns of worsening fatigue, palpitations with shortness of breath over the last 2 weeks. Recommend patient take diltiazem 240 mg at night, instead of afternoon as patient's symptoms tend to occur in the morning. In view of associated dyspnea and fatigue will obtain cardiac monitor. Counseled patient that dehydration may be exacerbating symptoms as ne does not drink water throughout the day.  Advised liberal hydration.   In regard to hypertension, blood pressure is elevated in the office, but home blood pressure readings are well controlled. Therefore will not make changes to anti-hypertensive medications at this time. Patient's 10 year ASCVD risk is 10.2%. Discussed initiating statin therapy, patient prefers to recheck lipid panel and then reevaluate need for statin. Will obtain lipid profile testing.   Counseled patient at length regarding reducing alcohol intake and smoking cessation. Patient appears motivated to quit, will assess progress at next visit.   Follow up in 6 weeks, sooner if needed, for palpitations, shortness of breath, and hyperlipidemia.    Wesley Halsted, PA-C 03/26/2020, 5:32 PM Office: [phone]

## 2020-03-26 ENCOUNTER — Other Ambulatory Visit: Payer: BC Managed Care – PPO

## 2020-03-26 ENCOUNTER — Encounter: Payer: Self-pay | Admitting: Student

## 2020-03-26 ENCOUNTER — Ambulatory Visit: Payer: BC Managed Care – PPO | Admitting: Student

## 2020-03-26 ENCOUNTER — Other Ambulatory Visit: Payer: Self-pay

## 2020-03-26 VITALS — BP 140/87 | HR 81 | Temp 98.2°F | Wt 168.0 lb

## 2020-03-26 DIAGNOSIS — E782 Mixed hyperlipidemia: Secondary | ICD-10-CM | POA: Diagnosis not present

## 2020-03-26 DIAGNOSIS — F17200 Nicotine dependence, unspecified, uncomplicated: Secondary | ICD-10-CM

## 2020-03-26 DIAGNOSIS — F17210 Nicotine dependence, cigarettes, uncomplicated: Secondary | ICD-10-CM | POA: Diagnosis not present

## 2020-03-26 DIAGNOSIS — R002 Palpitations: Secondary | ICD-10-CM

## 2020-03-26 DIAGNOSIS — R0602 Shortness of breath: Secondary | ICD-10-CM

## 2020-03-26 DIAGNOSIS — I10 Essential (primary) hypertension: Secondary | ICD-10-CM

## 2020-03-26 MED ORDER — DILTIAZEM HCL ER COATED BEADS 240 MG PO CP24: 240.0000 mg | ORAL_CAPSULE | Freq: Every evening | ORAL | 0 refills | Status: DC

## 2020-04-05 ENCOUNTER — Other Ambulatory Visit: Payer: Self-pay

## 2020-04-05 ENCOUNTER — Encounter (HOSPITAL_COMMUNITY): Payer: Self-pay

## 2020-04-05 ENCOUNTER — Ambulatory Visit (HOSPITAL_COMMUNITY)
Admission: EM | Admit: 2020-04-05 | Discharge: 2020-04-05 | Disposition: A | Discharge status: Home / Self Care | Payer: BC Managed Care – PPO | Attending: Urgent Care | Admitting: Urgent Care

## 2020-04-05 DIAGNOSIS — B356 Tinea cruris: Secondary | ICD-10-CM

## 2020-04-05 MED ORDER — CLOTRIMAZOLE-BETAMETHASONE 1-0.05 % EX CREA: TOPICAL_CREAM | Freq: Two times a day (BID) | CUTANEOUS | 0 refills | Status: DC

## 2020-04-05 NOTE — ED Provider Notes (Signed)
[facility]   MRN: [id number] DOB: May 06, 1975  Subjective:   Wesley Best is a 45 y.o. male presenting for 1 week history of recurrent right-sided groin itching and rash.  Patient has had difficulty with jock itch before and has responded very well to clotrimazole betamethasone.  Unfortunately the last tube that he had for this got washed in his washer and is not working now.  He did go to the emergency room and got a nystatin powder which made his symptoms worse.  He would like a refill on his clotrimazole betamethasone cream.  Denies fever, nausea, vomiting, swelling, dysuria, painful urination, swelling, testicular pain.  No current facility-administered medications for this encounter.  Current Outpatient Medications:  .  diltiazem (CARDIZEM CD) 240 MG 24 hr capsule, Take 1 capsule (240 mg total) by mouth at bedtime., Disp: 90 capsule, Rfl: 0 .  lisinopril-hydrochlorothiazide (ZESTORETIC) 20-12.5 MG tablet, Take 1 tablet by mouth every morning., Disp: 90 tablet, Rfl: 0 .  nystatin (MYCOSTATIN/NYSTOP) powder, Apply topically 2 (two) times daily., Disp: 30 g, Rfl: 0   Allergies  Allergen Reactions  . Penicillins Hives  . Shellfish Allergy Swelling    Past Medical History:  Diagnosis Date  . Abscess   . Hypertension      Past Surgical History:  Procedure Laterality Date  . FOOT SURGERY    . HERNIA REPAIR    . lymph node removal      Family History  Problem Relation Age of Onset  . Hypertension Mother   . Diabetes Mother   . Stroke Father   . Hypertension Father   . Heart failure Neg Hx     Social History   Tobacco Use  . Smoking status: Current Every Day Smoker    Packs/day: 13.00    Types: Cigarettes    Start date: 07/26/1998  . Smokeless tobacco: Never Used  Substance Use Topics  . Alcohol use: Yes    Comment: 12 per week  . Drug use: No    ROS   Objective:   Vitals: BP (!) 147/88 (BP Location: Right Arm)   Pulse 64   Temp 98.1 F  (36.7 C) (Oral)   Resp 18   SpO2 98%   Physical Exam Constitutional:      General: He is not in acute distress.    Appearance: Normal appearance. He is well-developed and normal weight. He is not ill-appearing, toxic-appearing or diaphoretic.  HENT:     Head: Normocephalic and atraumatic.     Right Ear: External ear normal.     Left Ear: External ear normal.     Nose: Nose normal.     Mouth/Throat:     Pharynx: Oropharynx is clear.  Eyes:     General: No scleral icterus.       Right eye: No discharge.        Left eye: No discharge.     Extraocular Movements: Extraocular movements intact.     Pupils: Pupils are equal, round, and reactive to light.  Cardiovascular:     Rate and Rhythm: Normal rate.  Pulmonary:     Effort: Pulmonary effort is normal.  Genitourinary:   Musculoskeletal:     Cervical back: Normal range of motion.  Neurological:     Mental Status: He is alert and oriented to person, place, and time.  Psychiatric:        Mood and Affect: Mood normal.        Behavior: Behavior normal.  Thought Content: Thought content normal.        Judgment: Judgment normal.      Assessment and Plan :   PDMP not reviewed this encounter.  1. Jock itch     Counseled patient that he needs to take this medication for 2 weeks as he is not done treatment as consistently as he is supposed to.  Follow-up with primary care provider. Counseled patient on potential for adverse effects with medications prescribed/recommended today, ER and return-to-clinic precautions discussed, patient verbalized understanding.    Wallis Bamberg, [place] 04/05/20 2446

## 2020-04-05 NOTE — ED Triage Notes (Signed)
Pt presents with recurrent jock itch.

## 2020-04-16 DIAGNOSIS — R0602 Shortness of breath: Secondary | ICD-10-CM | POA: Diagnosis not present

## 2020-04-16 DIAGNOSIS — R002 Palpitations: Secondary | ICD-10-CM | POA: Diagnosis not present

## 2020-04-17 DIAGNOSIS — R002 Palpitations: Secondary | ICD-10-CM | POA: Diagnosis not present

## 2020-04-17 DIAGNOSIS — R0602 Shortness of breath: Secondary | ICD-10-CM | POA: Diagnosis not present

## 2020-05-07 ENCOUNTER — Ambulatory Visit: Payer: BC Managed Care – PPO | Admitting: Cardiology

## 2020-06-03 ENCOUNTER — Other Ambulatory Visit: Payer: Self-pay

## 2020-06-03 DIAGNOSIS — I10 Essential (primary) hypertension: Secondary | ICD-10-CM

## 2020-06-03 MED ORDER — LISINOPRIL-HYDROCHLOROTHIAZIDE 20-12.5 MG PO TABS: 1.0000 | ORAL_TABLET | Freq: Every morning | ORAL | 0 refills | Status: DC

## 2020-07-22 ENCOUNTER — Other Ambulatory Visit: Payer: Self-pay | Admitting: Student

## 2020-07-22 DIAGNOSIS — I10 Essential (primary) hypertension: Secondary | ICD-10-CM

## 2020-08-02 ENCOUNTER — Other Ambulatory Visit: Payer: Self-pay | Admitting: Student

## 2020-08-02 DIAGNOSIS — I10 Essential (primary) hypertension: Secondary | ICD-10-CM

## 2020-08-30 ENCOUNTER — Other Ambulatory Visit: Payer: Self-pay | Admitting: Student

## 2020-08-30 DIAGNOSIS — I10 Essential (primary) hypertension: Secondary | ICD-10-CM

## 2021-06-21 ENCOUNTER — Ambulatory Visit (HOSPITAL_COMMUNITY): Payer: Self-pay

## 2021-06-24 ENCOUNTER — Encounter (HOSPITAL_COMMUNITY): Payer: Self-pay

## 2021-06-24 ENCOUNTER — Ambulatory Visit (HOSPITAL_COMMUNITY)
Admission: RE | Admit: 2021-06-24 | Discharge: 2021-06-24 | Disposition: A | Discharge status: Home / Self Care | Payer: PRIVATE HEALTH INSURANCE | Source: Ambulatory Visit | Attending: Physician Assistant | Admitting: Physician Assistant

## 2021-06-24 VITALS — BP 168/108 | HR 87 | Temp 98.0°F | Resp 18

## 2021-06-24 DIAGNOSIS — B356 Tinea cruris: Secondary | ICD-10-CM

## 2021-06-24 DIAGNOSIS — I10 Essential (primary) hypertension: Secondary | ICD-10-CM | POA: Diagnosis not present

## 2021-06-24 MED ORDER — ECONAZOLE NITRATE 1 % EX CREA: TOPICAL_CREAM | Freq: Two times a day (BID) | CUTANEOUS | 1 refills | Status: AC

## 2021-06-24 MED ORDER — DILTIAZEM HCL ER COATED BEADS 240 MG PO CP24: ORAL_CAPSULE | ORAL | 0 refills | Status: AC

## 2021-06-24 NOTE — Discharge Instructions (Addendum)
Advise to use Spectazole cream twice daily to area. ?Advised if symptoms fail to improve consider dermatology evaluation. ? ?

## 2021-06-24 NOTE — ED Provider Notes (Signed)
?[facility] ? ? ? ?CSN: [id number] ?Arrival date & time: 06/24/21  1421 ? ? ?  ? ?History   ?Chief Complaint ?Chief Complaint  ?Patient presents with  ? 2:30appt  ? Groin Pain  ? ? ?HPI ?Marcel Gary is a 46 y.o. male.  ? ?46 year old male presents with rash in the groin patient indicates for the past year or more he has had recurrent rash to the groin area.  He relates the rash is worse around the testicles the inner aspects of the going and around the buttocks area.  Patient Isabella Stalling that the rash is worse in a hot environment, but improves with cool environments.  Patient has been using some cream that was given that had a steroid component to it.  He relates that this helps but then when he finishes cream the rash returns shortly.  Patient relates he is not having any hematuria, discharge or dysuria. ? ?Patient indicates that he has been having some left testicular irritation after having sex.  He relates when he becomes rales that the left testicle rises into the groin, and then it returns after he finishes ejaculating.  Patient denies any trauma to the area.  Patient indicates that he has never had any surgery to the testicular area right or left. ? ?Patient indicates he would like to have a refill of the diltiazem for his blood pressure.  He relates that this medication is worked well for him without side effects.  He relates he does not like taking the lisinopril.  Patient advised that he needs to be taking blood pressure medicine that when the diltiazem is filled he would need to stop the lisinopril. ? ? ?Groin Pain ? ? ?Past Medical History:  ?Diagnosis Date  ? Abscess   ? Hypertension   ? ? ?There are no problems to display for this patient. ? ? ?Past Surgical History:  ?Procedure Laterality Date  ? FOOT SURGERY    ? HERNIA REPAIR    ? lymph node removal    ? ? ? ? ? ?Home Medications   ? ?Prior to Admission medications   ?Medication Sig Start Date End Date Taking? Authorizing Provider  ?econazole  nitrate 1 % cream Apply topically 2 (two) times daily for 15 days. 06/24/21 07/09/21 Yes Ellsworth Lennox, PA-C  ?diltiazem (CARDIZEM CD) 240 MG 24 hr capsule TAKE 1 CAPSULE(240 MG) BY MOUTH AT BEDTIME 06/24/21   Ellsworth Lennox, PA-C  ?lisinopril-hydrochlorothiazide (ZESTORETIC) 20-12.5 MG tablet TAKE 1 TABLET BY MOUTH EVERY MORNING 08/30/20   Cantwell, Celeste C, PA-C  ?albuterol (VENTOLIN HFA) 108 (90 Base) MCG/ACT inhaler Inhale 1-2 puffs into the lungs every 6 (six) hours as needed for wheezing or shortness of breath. ?Patient not taking: Reported on 03/23/2020 01/13/20 03/24/20  Lew Dawes, PA-C  ? ? ?Family History ?Family History  ?Problem Relation Age of Onset  ? Hypertension Mother   ? Diabetes Mother   ? Stroke Father   ? Hypertension Father   ? Heart failure Neg Hx   ? ? ?Social History ?Social History  ? ?Tobacco Use  ? Smoking status: Every Day  ?  Packs/day: 13.00  ?  Types: Cigarettes  ?  Start date: 07/26/1998  ? Smokeless tobacco: Never  ?Substance Use Topics  ? Alcohol use: Yes  ?  Comment: 12 per week  ? Drug use: No  ? ? ? ?Allergies   ?Penicillins and Shellfish allergy ? ? ?Review of Systems ?Review of Systems  ?Skin:  Positive for rash (testicular areas).  ? ? ?Physical Exam ?Triage Vital Signs ?ED Triage Vitals  ?Enc Vitals Group  ?   BP 06/24/21 1443 (!) 168/108  ?   Pulse Rate 06/24/21 1443 87  ?   Resp 06/24/21 1443 18  ?   Temp 06/24/21 1443 98 ?F (36.7 ?C)  ?   Temp Source 06/24/21 1443 Oral  ?   SpO2 06/24/21 1443 98 %  ?   Weight --   ?   Height --   ?   Head Circumference --   ?   Peak Flow --   ?   Pain Score 06/24/21 1442 0  ?   Pain Loc --   ?   Pain Edu? --   ?   Excl. in GC? --   ? ?No data found. ? ?Updated Vital Signs ?BP (!) 168/108 (BP Location: Right Arm)   Pulse 87   Temp 98 ?F (36.7 ?C) (Oral)   Resp 18   SpO2 98%  ? ?Visual Acuity ?Right Eye Distance:   ?Left Eye Distance:   ?Bilateral Distance:   ? ?Right Eye Near:   ?Left Eye Near:    ?Bilateral Near:    ? ?Physical  Exam ?Constitutional:   ?   Appearance: Normal appearance.  ?Genitourinary: ?   Penis: Normal.   ?   Testes: Cremasteric reflex is present.  ?   Comments: Scrotum: Lateral aspects of the scrotal sac bilaterally has a diffuse rash present there are areas of excoriation present.  There is no swelling and no evidence of infection. ?Testicles: Left testicle is normal on palpation without masses.  There is no right testicle palpable.   ?(Patient indicates he has a right testicle and has never had surgery.) ?Neurological:  ?   Mental Status: He is alert.  ? ? ? ?UC Treatments / Results  ?Labs ?(all labs ordered are listed, but only abnormal results are displayed) ?Labs Reviewed - No data to display ? ?EKG ? ? ?Radiology ?No results found. ? ?Procedures ?Procedures (including critical care time) ? ?Medications Ordered in UC ?Medications - No data to display ? ?Initial Impression / Assessment and Plan / UC Course  ?I have reviewed the triage vital signs and the nursing notes. ? ?Pertinent labs & imaging results that were available during my care of the patient were reviewed by me and considered in my medical decision making (see chart for details). ? ?  ? ?Plan: ?Patient is advised to use cream to area twice daily for the next 10 to 14 days. ?Patient advised to discontinue the lisinopril and start the diltiazem daily. ?Patient advised to follow-up with PCP or return to urgent care if symptoms fail to improve. ?Patient advised to consider dermatology referral if symptoms fail to improve with the prescription. ?Final Clinical Impressions(s) / UC Diagnoses  ? ?Final diagnoses:  ?Tinea cruris  ?Essential hypertension, benign  ? ? ? ?Discharge Instructions   ? ?  ?Advise to use Spectazole cream twice daily to area. ?Advised if symptoms fail to improve consider dermatology evaluation. ? ? ? ? ?ED Prescriptions   ? ? Medication Sig Dispense Auth. Provider  ? diltiazem (CARDIZEM CD) 240 MG 24 hr capsule TAKE 1 CAPSULE(240 MG) BY  MOUTH AT BEDTIME 90 capsule Ellsworth Lennox, PA-C  ? econazole nitrate 1 % cream Apply topically 2 (two) times daily for 15 days. 15 g Ellsworth Lennox, PA-C  ? ?  ? ?PDMP not reviewed this encounter. ?  ?  Ellsworth LennoxJames, Harlowe Dowler, PA-C ?06/24/21 1526 ? ?

## 2021-06-24 NOTE — ED Triage Notes (Signed)
One week h/o left sided groin and penile itching. Pt reports that he was seen previously for similar sxs tx with a cream that gives temporary relief. ?

## 2021-07-07 IMAGING — DX DG CHEST 1V PORT
2 series · 2 of 2 positions shown · non-contrast
Comparison: 12/15/2017

CLINICAL DATA: Dyspnea. Intermittent cardiac palpitations. Chest
pain and some shortness of breath.

EXAM:
PORTABLE CHEST 1 VIEW

[chest ap (1 of 2)]
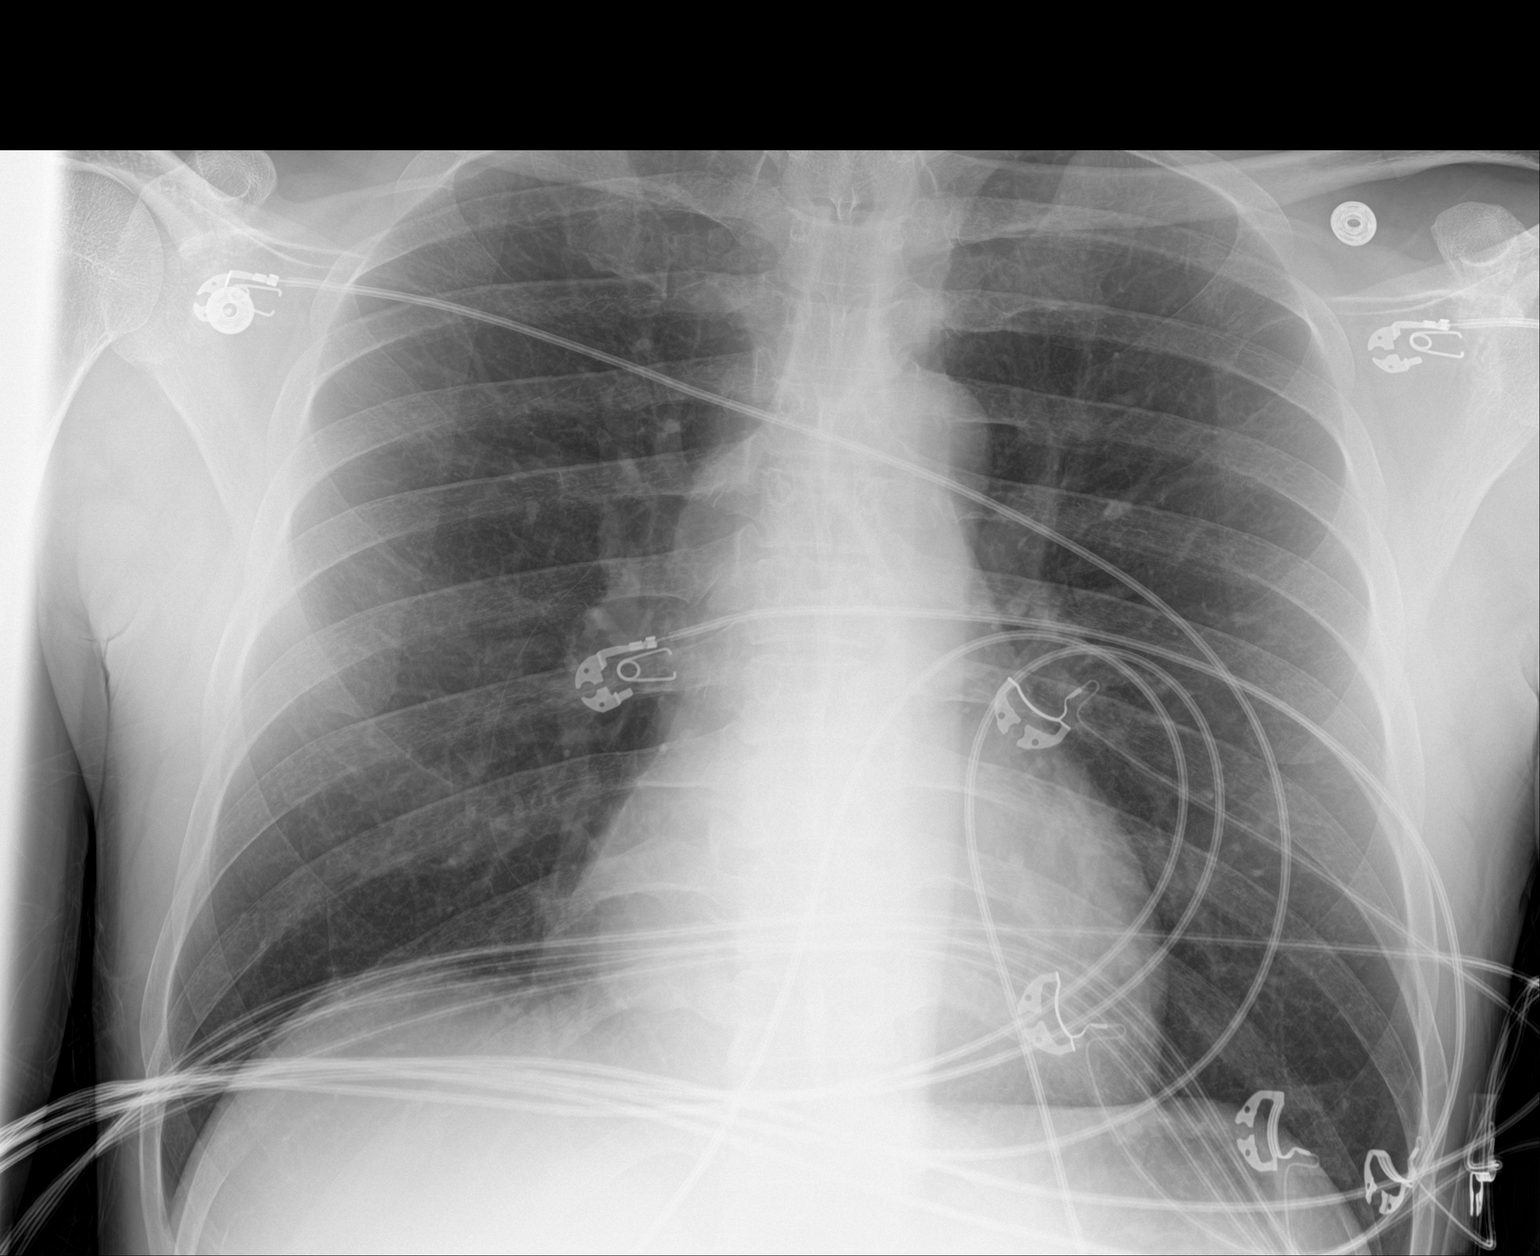

[chest ap (2 of 2)]
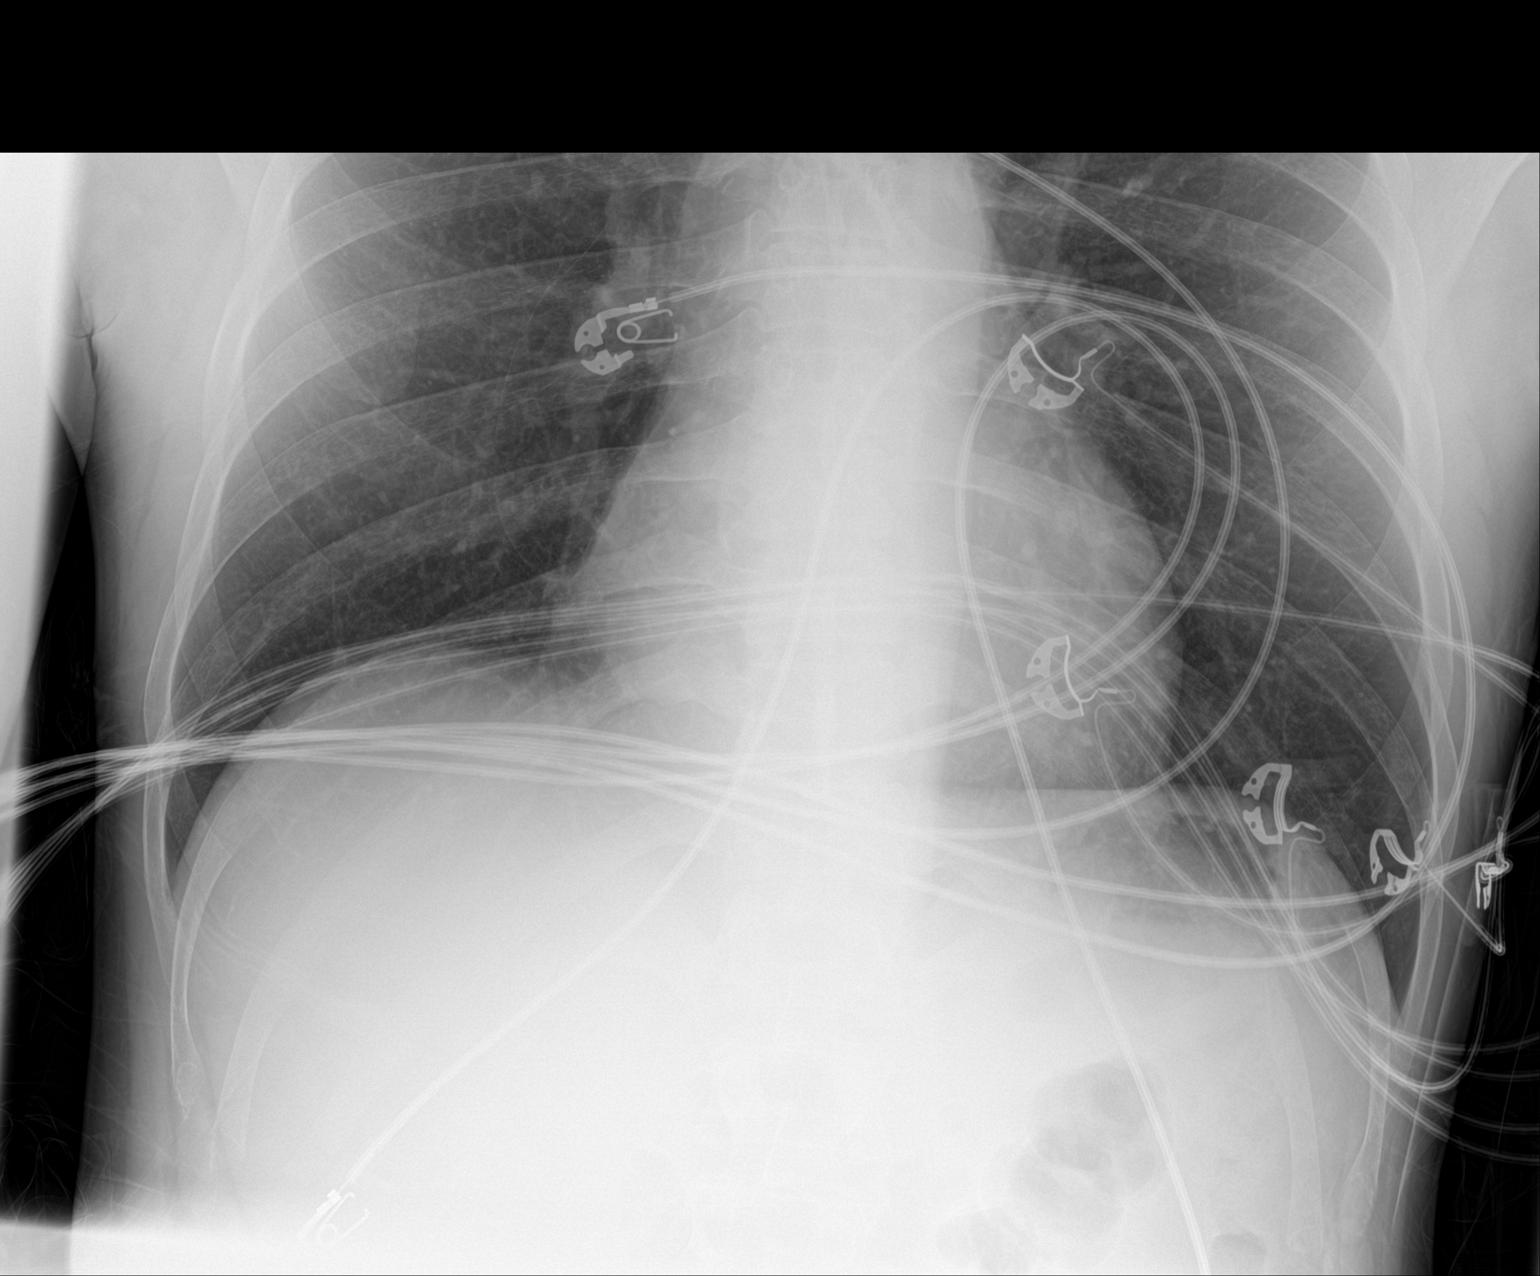

[2 of 2 positions shown; findings below may reference images not displayed]

FINDINGS: The heart size and mediastinal contours are within normal limits.
Both lungs are clear. The visualized skeletal structures are
unremarkable.
IMPRESSION: No active disease.

## 2021-08-16 ENCOUNTER — Other Ambulatory Visit: Payer: Self-pay | Admitting: Student

## 2021-08-16 DIAGNOSIS — I10 Essential (primary) hypertension: Secondary | ICD-10-CM

## 2023-08-16 ENCOUNTER — Encounter (HOSPITAL_COMMUNITY): Payer: Self-pay

## 2023-08-16 ENCOUNTER — Ambulatory Visit (HOSPITAL_COMMUNITY)
Admission: RE | Admit: 2023-08-16 | Discharge: 2023-08-16 | Disposition: A | Discharge status: Home / Self Care | Payer: Self-pay | Source: Ambulatory Visit

## 2023-08-16 VITALS — BP 134/90 | HR 76 | Temp 97.9°F | Resp 14

## 2023-08-16 DIAGNOSIS — Z23 Encounter for immunization: Secondary | ICD-10-CM | POA: Diagnosis not present

## 2023-08-16 DIAGNOSIS — S61210A Laceration without foreign body of right index finger without damage to nail, initial encounter: Secondary | ICD-10-CM

## 2023-08-16 MED ORDER — TETANUS-DIPHTH-ACELL PERTUSSIS 5-2.5-18.5 LF-MCG/0.5 IM SUSY
0.5000 mL | PREFILLED_SYRINGE | Freq: Once | INTRAMUSCULAR | Status: AC
  Administered 2023-08-16: 0.5 mL via INTRAMUSCULAR

## 2023-08-16 MED ORDER — TETANUS-DIPHTH-ACELL PERTUSSIS 5-2.5-18.5 LF-MCG/0.5 IM SUSY
PREFILLED_SYRINGE | INTRAMUSCULAR | Status: AC
  Filled 2023-08-16: qty 0.5

## 2023-08-16 MED ORDER — MUPIROCIN 2 % EX OINT: 1.0000 | TOPICAL_OINTMENT | Freq: Two times a day (BID) | CUTANEOUS | 0 refills | Status: AC

## 2023-08-16 MED ORDER — BACITRACIN ZINC 500 UNIT/GM EX OINT: TOPICAL_OINTMENT | Freq: Once | CUTANEOUS | Status: AC

## 2023-08-16 NOTE — ED Provider Notes (Signed)
 [facility]    CSN: [id number] Arrival date & time: 08/16/23  9148      History   Chief Complaint Chief Complaint  Patient presents with   appt 9    HPI Wesley Best is a 48 y.o. male.   Wesley Best is a 48 y.o. male presenting for chief complaint of laceration to the right index finger that happened 2 days ago.  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  He is unsure of date of last tetanus injection.  Denies numbness and tingling distally to the right index finger, persistent bleeding, purulent drainage, redness, and fever/chills.  States laceration has started to heal a little bit over the last 2 days. He has been using peroxide and over-the-counter Neosporin to the wound with some relief.     Past Medical History:  Diagnosis Date   Abscess    Hypertension     There are no active problems to display for this patient.   Past Surgical History:  Procedure Laterality Date   FOOT SURGERY     HERNIA REPAIR     lymph node removal         Home Medications    Prior to Admission medications   Medication Sig Start Date End Date Taking? Authorizing Provider  losartan (COZAAR) 50 MG tablet Take 50 mg by mouth daily. 07/18/23  Yes [provider]  mupirocin ointment (BACTROBAN) 2 % Apply 1 Application topically 2 (two) times daily. 08/16/23  Yes Enedelia Dorna HERO, FNP  rosuvastatin (CRESTOR) 20 MG tablet Take 20 mg by mouth at bedtime. 08/04/23  Yes [provider]  diltiazem  (CARDIZEM  CD) 240 MG 24 hr capsule TAKE 1 CAPSULE(240 MG) BY MOUTH AT BEDTIME 06/24/21   Lynwood Lenis, PA-C  lisinopril -hydrochlorothiazide  (ZESTORETIC ) 20-12.5 MG tablet TAKE 1 TABLET BY MOUTH EVERY MORNING 08/30/20   Cantwell, Celeste C, PA-C  albuterol  (VENTOLIN  HFA) 108 (90 Base) MCG/ACT inhaler Inhale 1-2 puffs into the lungs every 6 (six) hours as needed for wheezing or shortness of breath. Patient not taking: Reported on  03/23/2020 01/13/20 03/24/20  Wieters, Hallie C, PA-C    Family History Family History  Problem Relation Age of Onset   Hypertension Mother    Diabetes Mother    Stroke Father    Hypertension Father    Heart failure Neg Hx     Social History Social History   Tobacco Use   Smoking status: Every Day    Current packs/day: 13.00    Average packs/day: 13.0 packs/day for 25.1 years (325.7 ttl pk-yrs)    Types: Cigarettes    Start date: 07/26/1998   Smokeless tobacco: Never  Substance Use Topics   Alcohol use: Yes    Comment: 12 per week   Drug use: No     Allergies   Penicillins and Shellfish allergy   Review of Systems Review of Systems Per HPI  Physical Exam Triage Vital Signs ED Triage Vitals  Encounter Vitals Group     BP 08/16/23 0927 (!) 134/90     Girls Systolic BP Percentile --      Girls Diastolic BP Percentile --      Boys Systolic BP Percentile --      Boys Diastolic BP Percentile --      Pulse Rate 08/16/23 0927 76     Resp 08/16/23 0927 14     Temp 08/16/23 0927 97.9 F (36.6 C)  Temp Source 08/16/23 0927 Oral     SpO2 08/16/23 0927 97 %     Weight --      Height --      Head Circumference --      Peak Flow --      Pain Score 08/16/23 0926 0     Pain Loc --      Pain Education --      Exclude from Growth Chart --    No data found.  Updated Vital Signs BP (!) 134/90 (BP Location: Left Arm)   Pulse 76   Temp 97.9 F (36.6 C) (Oral)   Resp 14   SpO2 97%   Visual Acuity Right Eye Distance:   Left Eye Distance:   Bilateral Distance:    Right Eye Near:   Left Eye Near:    Bilateral Near:     Physical Exam Vitals and nursing note reviewed.  Constitutional:      Appearance: He is not ill-appearing or toxic-appearing.  HENT:     Head: Normocephalic and atraumatic.     Right Ear: Hearing and external ear normal.     Left Ear: Hearing and external ear normal.     Nose: Nose normal.     Mouth/Throat:     Lips: Pink.   Eyes:      General: Lids are normal. Vision grossly intact. Gaze aligned appropriately.     Extraocular Movements: Extraocular movements intact.     Conjunctiva/sclera: Conjunctivae normal.   Pulmonary:     Effort: Pulmonary effort is normal.   Musculoskeletal:     Right hand: Laceration (1.5cm laceration to the right index finger, see image below.) present. No swelling, deformity, tenderness or bony tenderness. Normal range of motion. Normal strength. Normal sensation. There is no disruption of two-point discrimination. Normal capillary refill. Normal pulse.     Cervical back: Neck supple.     Comments: Less than 2 cap refill of affected digit.  Right +2 radial pulse. Sensation and strength intact to distal affected digit.    Skin:    General: Skin is warm and dry.     Capillary Refill: Capillary refill takes less than 2 seconds.     Findings: No rash.   Neurological:     General: No focal deficit present.     Mental Status: He is alert and oriented to person, place, and time. Mental status is at baseline.     Cranial Nerves: No dysarthria or facial asymmetry.   Psychiatric:        Mood and Affect: Mood normal.        Speech: Speech normal.        Behavior: Behavior normal.        Thought Content: Thought content normal.        Judgment: Judgment normal.    Right index finger   Right index finger    UC Treatments / Results  Labs (all labs ordered are listed, but only abnormal results are displayed) Labs Reviewed - No data to display  EKG   Radiology No results found.  Procedures Procedures (including critical care time)  Medications Ordered in UC Medications  Tdap (BOOSTRIX) injection 0.5 mL (has no administration in time range)  bacitracin ointment (has no administration in time range)    Initial Impression / Assessment and Plan / UC Course  I have reviewed the triage vital signs and the nursing notes.  Pertinent labs & imaging results that were available during my  care of the patient were reviewed by me and considered in my medical decision making (see chart for details).   1.  Laceration of right index finger without foreign body without damage to nail, need for tetanus booster Delayed presentation to clinic outside of 18-hour window for laceration repair, therefore unable to suture to repair laceration. No signs of bacterial infection today, however I would like for him to use mupirocin ointment twice daily for 7 days to cover for infection. Discussed wound care and infection return precautions. Tetanus injection updated today. Neurovascularly intact to distal digit.  Counseled patient on potential for adverse effects with medications prescribed/recommended today, strict ER and return-to-clinic precautions discussed, patient verbalized understanding.    Final Clinical Impressions(s) / UC Diagnoses   Final diagnoses:  Laceration of right index finger without foreign body without damage to nail, initial encounter  Need for tetanus booster     Discharge Instructions      Apply mupirocin ointment to your wounds twice daily for the next 7 days.  Watch for worsening signs of infection such as redness, swelling, pus, or pain.  We updated your tetanus today.  If you develop any new or worsening symptoms or if your symptoms do not start to improve, please return here or follow-up with your primary care provider. If your symptoms are severe, please go to the emergency room.     ED Prescriptions     Medication Sig Dispense Auth. Provider   mupirocin ointment (BACTROBAN) 2 % Apply 1 Application topically 2 (two) times daily. 22 g Enedelia Dorna HERO, FNP      PDMP not reviewed this encounter.   Enedelia Dorna HERO, [place] 08/16/23 1019

## 2023-08-16 NOTE — ED Triage Notes (Signed)
 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 Wanting to get check. Pt been cleaning and keeping covered.

## 2023-08-16 NOTE — Discharge Instructions (Addendum)
 Apply mupirocin ointment to your wounds twice daily for the next 7 days.  Watch for worsening signs of infection such as redness, swelling, pus, or pain.  We updated your tetanus today.  If you develop any new or worsening symptoms or if your symptoms do not start to improve, please return here or follow-up with your primary care provider. If your symptoms are severe, please go to the emergency room.
